# Patient Record
Sex: Female | Born: 1975 | Hispanic: No | Marital: Married | Smoking: Never smoker
Health system: Southern US, Community
[De-identification: ages and names within clinical notes are randomized; demographics above are authoritative.]

## PROBLEM LIST (undated history)

## (undated) DIAGNOSIS — E119 Type 2 diabetes mellitus without complications: Secondary | ICD-10-CM

## (undated) DIAGNOSIS — I10 Essential (primary) hypertension: Secondary | ICD-10-CM

## (undated) HISTORY — PX: ABDOMINAL SURGERY: SHX537

---

## 2006-02-12 ENCOUNTER — Ambulatory Visit: Payer: Self-pay | Admitting: *Deleted

## 2006-02-12 ENCOUNTER — Ambulatory Visit: Payer: Self-pay | Admitting: Family Medicine

## 2006-02-28 ENCOUNTER — Ambulatory Visit: Payer: Self-pay | Admitting: Family Medicine

## 2006-03-07 ENCOUNTER — Ambulatory Visit: Payer: Self-pay | Admitting: Family Medicine

## 2006-07-29 ENCOUNTER — Ambulatory Visit: Payer: Self-pay | Admitting: Family Medicine

## 2006-08-19 ENCOUNTER — Encounter (INDEPENDENT_AMBULATORY_CARE_PROVIDER_SITE_OTHER): Payer: Self-pay | Admitting: *Deleted

## 2006-09-09 ENCOUNTER — Encounter (INDEPENDENT_AMBULATORY_CARE_PROVIDER_SITE_OTHER): Payer: Self-pay | Admitting: Family Medicine

## 2006-09-09 ENCOUNTER — Ambulatory Visit: Payer: Self-pay | Admitting: Family Medicine

## 2006-09-09 LAB — CONVERTED CEMR LAB
Basophils Relative: 0 % (ref 0–1)
Eosinophils Relative: 1 % (ref 0–5)
Hemoglobin: 12.8 g/dL (ref 12.0–15.0)
Lymphocytes Relative: 29 % (ref 12–46)
Monocytes Relative: 8 % (ref 3–11)
Neutro Abs: 5.8 10*3/uL (ref 1.7–7.7)
Platelets: 205 10*3/uL (ref 150–400)
RDW: 12.8 % (ref 11.5–14.0)
Rh Type: POSITIVE
Rubella: 89.9 intl units/mL — ABNORMAL HIGH
WBC: 9.4 10*3/uL (ref 4.0–10.5)

## 2006-09-16 ENCOUNTER — Encounter (INDEPENDENT_AMBULATORY_CARE_PROVIDER_SITE_OTHER): Payer: Self-pay | Admitting: Family Medicine

## 2006-09-16 ENCOUNTER — Encounter (INDEPENDENT_AMBULATORY_CARE_PROVIDER_SITE_OTHER): Payer: Self-pay | Admitting: Specialist

## 2006-09-16 ENCOUNTER — Ambulatory Visit: Payer: Self-pay | Admitting: Family Medicine

## 2006-09-16 LAB — CONVERTED CEMR LAB: GC Probe Amp, Genital: NEGATIVE

## 2006-10-09 ENCOUNTER — Ambulatory Visit (HOSPITAL_COMMUNITY): Admission: RE | Admit: 2006-10-09 | Discharge: 2006-10-09 | Payer: Self-pay | Admitting: Family Medicine

## 2006-10-16 ENCOUNTER — Ambulatory Visit: Payer: Self-pay | Admitting: Sports Medicine

## 2006-10-17 ENCOUNTER — Encounter (INDEPENDENT_AMBULATORY_CARE_PROVIDER_SITE_OTHER): Payer: Self-pay | Admitting: *Deleted

## 2006-10-27 ENCOUNTER — Telehealth (INDEPENDENT_AMBULATORY_CARE_PROVIDER_SITE_OTHER): Payer: Self-pay | Admitting: Family Medicine

## 2006-11-13 ENCOUNTER — Ambulatory Visit: Payer: Self-pay | Admitting: Family Medicine

## 2006-12-05 ENCOUNTER — Ambulatory Visit: Payer: Self-pay | Admitting: Family Medicine

## 2006-12-05 LAB — CONVERTED CEMR LAB
Bilirubin Urine: NEGATIVE
Hemoglobin: 10.9 g/dL
Protein, U semiquant: NEGATIVE
Whiff Test: NEGATIVE
pH: 6.5

## 2006-12-11 ENCOUNTER — Ambulatory Visit: Payer: Self-pay | Admitting: Sports Medicine

## 2006-12-11 ENCOUNTER — Encounter (INDEPENDENT_AMBULATORY_CARE_PROVIDER_SITE_OTHER): Payer: Self-pay | Admitting: Family Medicine

## 2006-12-15 ENCOUNTER — Telehealth: Payer: Self-pay | Admitting: *Deleted

## 2006-12-23 ENCOUNTER — Ambulatory Visit: Payer: Self-pay | Admitting: Family Medicine

## 2007-01-01 ENCOUNTER — Telehealth: Payer: Self-pay | Admitting: *Deleted

## 2007-01-07 ENCOUNTER — Telehealth (INDEPENDENT_AMBULATORY_CARE_PROVIDER_SITE_OTHER): Payer: Self-pay | Admitting: Family Medicine

## 2007-01-07 ENCOUNTER — Ambulatory Visit: Payer: Self-pay | Admitting: Sports Medicine

## 2007-01-08 ENCOUNTER — Telehealth (INDEPENDENT_AMBULATORY_CARE_PROVIDER_SITE_OTHER): Payer: Self-pay | Admitting: *Deleted

## 2007-01-09 ENCOUNTER — Ambulatory Visit: Payer: Self-pay | Admitting: Family Medicine

## 2007-01-29 ENCOUNTER — Encounter (INDEPENDENT_AMBULATORY_CARE_PROVIDER_SITE_OTHER): Payer: Self-pay | Admitting: Family Medicine

## 2007-01-29 ENCOUNTER — Ambulatory Visit: Payer: Self-pay | Admitting: Family Medicine

## 2007-01-31 LAB — CONVERTED CEMR LAB
AST: 13 units/L (ref 0–37)
Creatinine, Ser: 0.55 mg/dL (ref 0.40–1.20)
Glucose, Bld: 93 mg/dL (ref 70–99)
Potassium: 3.9 meq/L (ref 3.5–5.3)

## 2007-02-02 ENCOUNTER — Encounter (INDEPENDENT_AMBULATORY_CARE_PROVIDER_SITE_OTHER): Payer: Self-pay | Admitting: Family Medicine

## 2007-02-02 ENCOUNTER — Ambulatory Visit: Payer: Self-pay | Admitting: Family Medicine

## 2007-02-02 LAB — CONVERTED CEMR LAB
Chlamydia, DNA Probe: NEGATIVE
GC Probe Amp, Genital: NEGATIVE

## 2007-02-10 ENCOUNTER — Ambulatory Visit: Payer: Self-pay | Admitting: Family Medicine

## 2007-02-19 ENCOUNTER — Ambulatory Visit: Payer: Self-pay | Admitting: Family Medicine

## 2007-02-23 ENCOUNTER — Telehealth: Payer: Self-pay | Admitting: *Deleted

## 2007-02-24 ENCOUNTER — Inpatient Hospital Stay (HOSPITAL_COMMUNITY): Admission: AD | Admit: 2007-02-24 | Discharge: 2007-02-26 | Payer: Self-pay | Admitting: Obstetrics & Gynecology

## 2007-02-24 ENCOUNTER — Ambulatory Visit: Payer: Self-pay | Admitting: Gynecology

## 2007-02-26 ENCOUNTER — Telehealth (INDEPENDENT_AMBULATORY_CARE_PROVIDER_SITE_OTHER): Payer: Self-pay | Admitting: Family Medicine

## 2007-03-03 ENCOUNTER — Telehealth (INDEPENDENT_AMBULATORY_CARE_PROVIDER_SITE_OTHER): Payer: Self-pay | Admitting: Family Medicine

## 2007-03-10 ENCOUNTER — Telehealth (INDEPENDENT_AMBULATORY_CARE_PROVIDER_SITE_OTHER): Payer: Self-pay | Admitting: Family Medicine

## 2007-03-11 ENCOUNTER — Encounter (INDEPENDENT_AMBULATORY_CARE_PROVIDER_SITE_OTHER): Payer: Self-pay | Admitting: Family Medicine

## 2007-03-12 ENCOUNTER — Telehealth (INDEPENDENT_AMBULATORY_CARE_PROVIDER_SITE_OTHER): Payer: Self-pay | Admitting: Family Medicine

## 2007-04-15 ENCOUNTER — Ambulatory Visit: Payer: Self-pay | Admitting: Family Medicine

## 2007-07-06 ENCOUNTER — Encounter: Payer: Self-pay | Admitting: Internal Medicine

## 2007-07-06 ENCOUNTER — Ambulatory Visit: Payer: Self-pay | Admitting: Internal Medicine

## 2007-07-06 LAB — CONVERTED CEMR LAB: GC Probe Amp, Genital: NEGATIVE

## 2007-11-19 ENCOUNTER — Ambulatory Visit: Payer: Self-pay | Admitting: Internal Medicine

## 2007-12-02 ENCOUNTER — Ambulatory Visit: Payer: Self-pay | Admitting: Internal Medicine

## 2007-12-04 ENCOUNTER — Ambulatory Visit: Payer: Self-pay | Admitting: Internal Medicine

## 2008-02-25 ENCOUNTER — Ambulatory Visit: Payer: Self-pay | Admitting: Internal Medicine

## 2008-05-18 ENCOUNTER — Ambulatory Visit: Payer: Self-pay | Admitting: Internal Medicine

## 2008-08-15 ENCOUNTER — Ambulatory Visit: Payer: Self-pay | Admitting: Internal Medicine

## 2008-09-22 ENCOUNTER — Ambulatory Visit: Payer: Self-pay | Admitting: Internal Medicine

## 2008-10-14 ENCOUNTER — Ambulatory Visit: Payer: Self-pay | Admitting: Internal Medicine

## 2008-10-30 ENCOUNTER — Emergency Department (HOSPITAL_COMMUNITY): Admission: EM | Admit: 2008-10-30 | Discharge: 2008-10-31 | Payer: Self-pay | Admitting: Emergency Medicine

## 2008-11-02 ENCOUNTER — Ambulatory Visit: Payer: Self-pay | Admitting: Internal Medicine

## 2008-12-14 ENCOUNTER — Ambulatory Visit: Payer: Self-pay | Admitting: Internal Medicine

## 2008-12-19 ENCOUNTER — Ambulatory Visit: Payer: Self-pay | Admitting: Internal Medicine

## 2009-03-13 ENCOUNTER — Ambulatory Visit: Payer: Self-pay | Admitting: Family Medicine

## 2009-06-02 ENCOUNTER — Ambulatory Visit: Payer: Self-pay | Admitting: Internal Medicine

## 2009-06-02 ENCOUNTER — Encounter: Payer: Self-pay | Admitting: Internal Medicine

## 2009-07-21 ENCOUNTER — Emergency Department (HOSPITAL_COMMUNITY): Admission: EM | Admit: 2009-07-21 | Discharge: 2009-07-21 | Payer: Self-pay | Admitting: Emergency Medicine

## 2009-08-30 ENCOUNTER — Ambulatory Visit: Payer: Self-pay | Admitting: Internal Medicine

## 2009-10-04 ENCOUNTER — Ambulatory Visit: Payer: Self-pay | Admitting: Internal Medicine

## 2009-10-04 LAB — CONVERTED CEMR LAB
BUN: 7 mg/dL (ref 6–23)
Basophils Absolute: 0 10*3/uL (ref 0.0–0.1)
Basophils Relative: 0 % (ref 0–1)
CO2: 21 meq/L (ref 19–32)
Chloride: 108 meq/L (ref 96–112)
Eosinophils Absolute: 0.1 10*3/uL (ref 0.0–0.7)
Eosinophils Relative: 1 % (ref 0–5)
HCT: 42.6 % (ref 36.0–46.0)
MCHC: 31.7 g/dL (ref 30.0–36.0)
Monocytes Relative: 5 % (ref 3–12)
Neutro Abs: 4 10*3/uL (ref 1.7–7.7)
RBC: 4.89 M/uL (ref 3.87–5.11)
RDW: 13.1 % (ref 11.5–15.5)
Sodium: 144 meq/L (ref 135–145)

## 2009-11-22 ENCOUNTER — Ambulatory Visit: Payer: Self-pay | Admitting: Internal Medicine

## 2009-11-30 ENCOUNTER — Ambulatory Visit: Payer: Self-pay | Admitting: Internal Medicine

## 2010-02-12 ENCOUNTER — Ambulatory Visit: Payer: Self-pay | Admitting: Internal Medicine

## 2010-04-30 ENCOUNTER — Ambulatory Visit: Payer: Self-pay | Admitting: Internal Medicine

## 2010-09-21 ENCOUNTER — Emergency Department (HOSPITAL_COMMUNITY): Payer: Self-pay

## 2010-09-21 ENCOUNTER — Emergency Department (HOSPITAL_COMMUNITY)
Admission: EM | Admit: 2010-09-21 | Discharge: 2010-09-21 | Disposition: A | Discharge status: Home / Self Care | Payer: Self-pay | Attending: Emergency Medicine | Admitting: Emergency Medicine

## 2010-09-21 DIAGNOSIS — R000 Tachycardia, unspecified: Secondary | ICD-10-CM | POA: Insufficient documentation

## 2010-09-21 DIAGNOSIS — N12 Tubulo-interstitial nephritis, not specified as acute or chronic: Secondary | ICD-10-CM | POA: Insufficient documentation

## 2010-09-21 DIAGNOSIS — R112 Nausea with vomiting, unspecified: Secondary | ICD-10-CM | POA: Insufficient documentation

## 2010-09-21 DIAGNOSIS — M545 Low back pain, unspecified: Secondary | ICD-10-CM | POA: Insufficient documentation

## 2010-09-21 DIAGNOSIS — R630 Anorexia: Secondary | ICD-10-CM | POA: Insufficient documentation

## 2010-09-21 DIAGNOSIS — R109 Unspecified abdominal pain: Secondary | ICD-10-CM | POA: Insufficient documentation

## 2010-09-21 DIAGNOSIS — M25559 Pain in unspecified hip: Secondary | ICD-10-CM | POA: Insufficient documentation

## 2010-09-21 DIAGNOSIS — R748 Abnormal levels of other serum enzymes: Secondary | ICD-10-CM | POA: Insufficient documentation

## 2010-09-21 LAB — COMPREHENSIVE METABOLIC PANEL
ALT: 22 U/L (ref 0–35)
BUN: 8 mg/dL (ref 6–23)
Calcium: 8.7 mg/dL (ref 8.4–10.5)
Glucose, Bld: 101 mg/dL — ABNORMAL HIGH (ref 70–99)
Potassium: 3.6 mEq/L (ref 3.5–5.1)

## 2010-09-21 LAB — DIFFERENTIAL
Basophils Absolute: 0 10*3/uL (ref 0.0–0.1)
Basophils Relative: 0 % (ref 0–1)
Lymphs Abs: 1.6 10*3/uL (ref 0.7–4.0)
Monocytes Relative: 6 % (ref 3–12)
Neutro Abs: 10.8 10*3/uL — ABNORMAL HIGH (ref 1.7–7.7)
Neutrophils Relative %: 82 % — ABNORMAL HIGH (ref 43–77)

## 2010-09-21 LAB — CBC
Hemoglobin: 13.1 g/dL (ref 12.0–15.0)
MCV: 84.4 fL (ref 78.0–100.0)
Platelets: 175 10*3/uL (ref 150–400)
RBC: 4.68 MIL/uL (ref 3.87–5.11)
RDW: 13.2 % (ref 11.5–15.5)
WBC: 13.2 10*3/uL — ABNORMAL HIGH (ref 4.0–10.5)

## 2010-09-21 LAB — URINE MICROSCOPIC-ADD ON

## 2010-09-21 LAB — URINALYSIS, ROUTINE W REFLEX MICROSCOPIC
Urine Glucose, Fasting: NEGATIVE mg/dL
pH: 6 (ref 5.0–8.0)

## 2010-09-21 LAB — LIPASE, BLOOD: Lipase: 19 U/L (ref 11–59)

## 2010-09-24 LAB — URINE CULTURE: Colony Count: >=100000

## 2010-09-26 ENCOUNTER — Inpatient Hospital Stay (HOSPITAL_COMMUNITY): Payer: Medicaid Other

## 2010-09-26 ENCOUNTER — Inpatient Hospital Stay (HOSPITAL_COMMUNITY)
Admission: AD | Admit: 2010-09-26 | Discharge: 2010-09-26 | Disposition: A | Discharge status: Home / Self Care | Payer: Medicaid Other | Source: Ambulatory Visit | Attending: Obstetrics and Gynecology | Admitting: Obstetrics and Gynecology

## 2010-09-26 DIAGNOSIS — R1110 Vomiting, unspecified: Secondary | ICD-10-CM | POA: Insufficient documentation

## 2010-09-26 DIAGNOSIS — Z30432 Encounter for removal of intrauterine contraceptive device: Secondary | ICD-10-CM | POA: Insufficient documentation

## 2010-09-26 DIAGNOSIS — N12 Tubulo-interstitial nephritis, not specified as acute or chronic: Secondary | ICD-10-CM | POA: Insufficient documentation

## 2010-09-26 LAB — URINE MICROSCOPIC-ADD ON

## 2010-09-26 LAB — CBC
HCT: 40.6 % (ref 36.0–46.0)
Hemoglobin: 13.2 g/dL (ref 12.0–15.0)
MCH: 27.2 pg (ref 26.0–34.0)
MCHC: 32.5 g/dL (ref 30.0–36.0)
RDW: 13.1 % (ref 11.5–15.5)

## 2010-09-26 LAB — URINALYSIS, ROUTINE W REFLEX MICROSCOPIC
Ketones, ur: NEGATIVE mg/dL
Nitrite: NEGATIVE
Protein, ur: NEGATIVE mg/dL
Specific Gravity, Urine: 1.015 (ref 1.005–1.030)
Urine Glucose, Fasting: NEGATIVE mg/dL

## 2010-09-26 LAB — DIFFERENTIAL
Lymphs Abs: 3.3 10*3/uL (ref 0.7–4.0)
Monocytes Relative: 7 % (ref 3–12)
Neutro Abs: 4.1 10*3/uL (ref 1.7–7.7)

## 2010-09-26 LAB — POCT PREGNANCY, URINE: Preg Test, Ur: NEGATIVE

## 2010-09-26 MED ORDER — IOHEXOL 300 MG/ML  SOLN
100.0000 mL | Freq: Once | INTRAMUSCULAR | Status: AC | PRN
  Administered 2010-09-26: 100 mL via INTRAVENOUS

## 2010-11-29 LAB — URINALYSIS, ROUTINE W REFLEX MICROSCOPIC
Bilirubin Urine: NEGATIVE
Nitrite: NEGATIVE
Specific Gravity, Urine: 1.014 (ref 1.005–1.030)
Urobilinogen, UA: 1 mg/dL (ref 0.0–1.0)

## 2010-11-29 LAB — DIFFERENTIAL
Basophils Absolute: 0 10*3/uL (ref 0.0–0.1)
Basophils Relative: 0 % (ref 0–1)
Neutro Abs: 9.7 10*3/uL — ABNORMAL HIGH (ref 1.7–7.7)
Neutrophils Relative %: 92 % — ABNORMAL HIGH (ref 43–77)

## 2010-11-29 LAB — BASIC METABOLIC PANEL
CO2: 21 mEq/L (ref 19–32)
Calcium: 8.3 mg/dL — ABNORMAL LOW (ref 8.4–10.5)
Creatinine, Ser: 0.82 mg/dL (ref 0.4–1.2)
GFR calc non Af Amer: >60 mL/min (ref >60)
Glucose, Bld: 106 mg/dL — ABNORMAL HIGH (ref 70–99)

## 2010-11-29 LAB — CBC
MCHC: 33.8 g/dL (ref 30.0–36.0)
Platelets: 163 10*3/uL (ref 150–400)
RDW: 13 % (ref 11.5–15.5)

## 2010-11-29 LAB — URINE MICROSCOPIC-ADD ON

## 2011-06-04 LAB — CBC
HCT: 31.4 — ABNORMAL LOW
Hemoglobin: 10.3 — ABNORMAL LOW
MCHC: 31.9
MCHC: 32.2
MCV: 69.2 — ABNORMAL LOW
Platelets: 180
RBC: 4.44
RDW: 18.5 — ABNORMAL HIGH
RDW: 18.6 — ABNORMAL HIGH
WBC: 9.8

## 2011-06-04 LAB — TSH: TSH: 1.336

## 2011-09-10 ENCOUNTER — Other Ambulatory Visit: Payer: Self-pay | Admitting: Family Medicine

## 2011-09-10 DIAGNOSIS — N644 Mastodynia: Secondary | ICD-10-CM

## 2011-09-10 DIAGNOSIS — N63 Unspecified lump in unspecified breast: Secondary | ICD-10-CM

## 2011-09-17 ENCOUNTER — Other Ambulatory Visit: Payer: Medicaid Other

## 2011-09-18 ENCOUNTER — Ambulatory Visit
Admission: RE | Admit: 2011-09-18 | Discharge: 2011-09-18 | Disposition: A | Discharge status: Home / Self Care | Payer: Self-pay | Source: Ambulatory Visit | Attending: Family Medicine | Admitting: Family Medicine

## 2011-09-18 DIAGNOSIS — N63 Unspecified lump in unspecified breast: Secondary | ICD-10-CM

## 2011-09-18 DIAGNOSIS — N644 Mastodynia: Secondary | ICD-10-CM

## 2011-10-20 ENCOUNTER — Encounter (HOSPITAL_COMMUNITY): Payer: Self-pay | Admitting: *Deleted

## 2011-10-20 ENCOUNTER — Inpatient Hospital Stay (HOSPITAL_COMMUNITY)
Admission: EM | Admit: 2011-10-20 | Discharge: 2011-10-21 | DRG: 639 | Disposition: A | Discharge status: Home / Self Care | Payer: Self-pay | Attending: Internal Medicine | Admitting: Internal Medicine

## 2011-10-20 ENCOUNTER — Emergency Department (HOSPITAL_COMMUNITY): Payer: Self-pay

## 2011-10-20 ENCOUNTER — Inpatient Hospital Stay (HOSPITAL_COMMUNITY): Payer: Self-pay

## 2011-10-20 DIAGNOSIS — E1010 Type 1 diabetes mellitus with ketoacidosis without coma: Principal | ICD-10-CM | POA: Diagnosis present

## 2011-10-20 DIAGNOSIS — R51 Headache: Secondary | ICD-10-CM | POA: Diagnosis present

## 2011-10-20 DIAGNOSIS — R5381 Other malaise: Secondary | ICD-10-CM | POA: Diagnosis present

## 2011-10-20 DIAGNOSIS — E860 Dehydration: Secondary | ICD-10-CM

## 2011-10-20 DIAGNOSIS — E1110 Type 2 diabetes mellitus with ketoacidosis without coma: Secondary | ICD-10-CM

## 2011-10-20 DIAGNOSIS — E119 Type 2 diabetes mellitus without complications: Secondary | ICD-10-CM

## 2011-10-20 DIAGNOSIS — E1100 Type 2 diabetes mellitus with hyperosmolarity without nonketotic hyperglycemic-hyperosmolar coma (NKHHC): Secondary | ICD-10-CM | POA: Insufficient documentation

## 2011-10-20 DIAGNOSIS — R5383 Other fatigue: Secondary | ICD-10-CM | POA: Diagnosis present

## 2011-10-20 LAB — URINALYSIS, ROUTINE W REFLEX MICROSCOPIC
Bilirubin Urine: NEGATIVE
Glucose, UA: >1000 mg/dL — AB
Ketones, ur: >80 mg/dL — AB
Leukocytes, UA: NEGATIVE
Nitrite: NEGATIVE
Protein, ur: 30 mg/dL — AB
Specific Gravity, Urine: 1.044 — ABNORMAL HIGH (ref 1.005–1.030)
Urobilinogen, UA: 0.2 mg/dL (ref 0.0–1.0)
pH: 5.5 (ref 5.0–8.0)

## 2011-10-20 LAB — BASIC METABOLIC PANEL WITH GFR
BUN: 19 mg/dL (ref 6–23)
CO2: 17 meq/L — ABNORMAL LOW (ref 19–32)
Calcium: 10.9 mg/dL — ABNORMAL HIGH (ref 8.4–10.5)
Chloride: 92 meq/L — ABNORMAL LOW (ref 96–112)
Creatinine, Ser: 0.8 mg/dL (ref 0.50–1.10)
GFR calc Af Amer: >90 mL/min
GFR calc non Af Amer: >90 mL/min
Glucose, Bld: 529 mg/dL — ABNORMAL HIGH (ref 70–99)
Potassium: 4.2 meq/L (ref 3.5–5.1)
Sodium: 130 meq/L — ABNORMAL LOW (ref 135–145)

## 2011-10-20 LAB — BASIC METABOLIC PANEL
Calcium: 8.9 mg/dL (ref 8.4–10.5)
Calcium: 9.1 mg/dL (ref 8.4–10.5)
Chloride: 106 mEq/L (ref 96–112)
Creatinine, Ser: 0.65 mg/dL (ref 0.50–1.10)
GFR calc Af Amer: >90 mL/min (ref >90)
GFR calc Af Amer: >90 mL/min (ref >90)
GFR calc non Af Amer: >90 mL/min (ref >90)
GFR calc non Af Amer: >90 mL/min (ref >90)
Sodium: 134 mEq/L — ABNORMAL LOW (ref 135–145)

## 2011-10-20 LAB — CBC
HCT: 41.8 % (ref 36.0–46.0)
Hemoglobin: 14.7 g/dL (ref 12.0–15.0)
MCH: 28.7 pg (ref 26.0–34.0)
MCHC: 35.2 g/dL (ref 30.0–36.0)
MCV: 81.6 fL (ref 78.0–100.0)
Platelets: 231 K/uL (ref 150–400)
RBC: 5.12 MIL/uL — ABNORMAL HIGH (ref 3.87–5.11)
RDW: 11.9 % (ref 11.5–15.5)
WBC: 9.9 K/uL (ref 4.0–10.5)

## 2011-10-20 LAB — DIFFERENTIAL
Basophils Absolute: 0 10*3/uL (ref 0.0–0.1)
Lymphocytes Relative: 36 % (ref 12–46)
Monocytes Absolute: 0.4 10*3/uL (ref 0.1–1.0)
Monocytes Relative: 4 % (ref 3–12)
Neutro Abs: 5.8 10*3/uL (ref 1.7–7.7)

## 2011-10-20 LAB — GLUCOSE, CAPILLARY
Glucose-Capillary: 522 mg/dL — ABNORMAL HIGH (ref 70–99)
Glucose-Capillary: 349 mg/dL — ABNORMAL HIGH (ref 70–99)
Glucose-Capillary: 201 mg/dL — ABNORMAL HIGH (ref 70–99)
Glucose-Capillary: 155 mg/dL — ABNORMAL HIGH (ref 70–99)

## 2011-10-20 LAB — HEMOGLOBIN A1C: Mean Plasma Glucose: 214 mg/dL — ABNORMAL HIGH (ref <117)

## 2011-10-20 LAB — URINE MICROSCOPIC-ADD ON

## 2011-10-20 LAB — CARDIAC PANEL(CRET KIN+CKTOT+MB+TROPI)
Relative Index: 1.8 (ref 0.0–2.5)
Troponin I: <0.3 ng/mL (ref <0.30)

## 2011-10-20 MED ORDER — SODIUM CHLORIDE 0.9 % IV SOLN
INTRAVENOUS | Status: DC
  Administered 2011-10-20 – 2011-10-21 (×2): via INTRAVENOUS

## 2011-10-20 MED ORDER — ACETAMINOPHEN 325 MG PO TABS: 650.0000 mg | ORAL_TABLET | Freq: Four times a day (QID) | ORAL | Status: DC | PRN

## 2011-10-20 MED ORDER — INSULIN GLARGINE 100 UNIT/ML ~~LOC~~ SOLN
10.0000 [IU] | Freq: Every day | SUBCUTANEOUS | Status: DC
  Administered 2011-10-20: 10 [IU] via SUBCUTANEOUS
  Filled 2011-10-20: qty 1

## 2011-10-20 MED ORDER — ACETAMINOPHEN 650 MG RE SUPP: 650.0000 mg | Freq: Four times a day (QID) | RECTAL | Status: DC | PRN

## 2011-10-20 MED ORDER — DEXTROSE 50 % IV SOLN: 25.0000 mL | INTRAVENOUS | Status: DC | PRN

## 2011-10-20 MED ORDER — ONDANSETRON HCL 4 MG/2ML IJ SOLN: 4.0000 mg | Freq: Four times a day (QID) | INTRAMUSCULAR | Status: DC | PRN

## 2011-10-20 MED ORDER — ONDANSETRON HCL 4 MG/2ML IJ SOLN
4.0000 mg | Freq: Once | INTRAMUSCULAR | Status: AC
  Administered 2011-10-20: 4 mg via INTRAVENOUS
  Filled 2011-10-20: qty 2

## 2011-10-20 MED ORDER — SODIUM CHLORIDE 0.9 % IV BOLUS (SEPSIS)
500.0000 mL | Freq: Once | INTRAVENOUS | Status: AC
  Administered 2011-10-20: 500 mL via INTRAVENOUS

## 2011-10-20 MED ORDER — SODIUM CHLORIDE 0.9 % IV SOLN
INTRAVENOUS | Status: DC
  Administered 2011-10-20: 10:00:00 via INTRAVENOUS

## 2011-10-20 MED ORDER — MORPHINE SULFATE 2 MG/ML IJ SOLN: 1.0000 mg | INTRAMUSCULAR | Status: DC | PRN

## 2011-10-20 MED ORDER — SODIUM CHLORIDE 0.9 % IV SOLN
INTRAVENOUS | Status: DC
  Administered 2011-10-20: 2.8 [IU]/h via INTRAVENOUS
  Administered 2011-10-20: 2.9 [IU]/h via INTRAVENOUS
  Administered 2011-10-20: 1.9 [IU]/h via INTRAVENOUS
  Filled 2011-10-20: qty 1

## 2011-10-20 MED ORDER — INSULIN ASPART 100 UNIT/ML ~~LOC~~ SOLN
0.0000 [IU] | Freq: Three times a day (TID) | SUBCUTANEOUS | Status: DC
  Administered 2011-10-20: 7 [IU] via SUBCUTANEOUS
  Administered 2011-10-21: 5 [IU] via SUBCUTANEOUS
  Administered 2011-10-21: 3 [IU] via SUBCUTANEOUS

## 2011-10-20 MED ORDER — ONDANSETRON HCL 4 MG PO TABS: 4.0000 mg | ORAL_TABLET | Freq: Four times a day (QID) | ORAL | Status: DC | PRN

## 2011-10-20 MED ORDER — DEXTROSE-NACL 5-0.45 % IV SOLN
INTRAVENOUS | Status: DC
  Administered 2011-10-20: 14:00:00 via INTRAVENOUS

## 2011-10-20 MED ORDER — INSULIN ASPART 100 UNIT/ML ~~LOC~~ SOLN
0.0000 [IU] | Freq: Three times a day (TID) | SUBCUTANEOUS | Status: DC
  Administered 2011-10-20: 1 [IU] via SUBCUTANEOUS

## 2011-10-20 MED ORDER — SENNOSIDES-DOCUSATE SODIUM 8.6-50 MG PO TABS
1.0000 | ORAL_TABLET | Freq: Every evening | ORAL | Status: DC | PRN
  Filled 2011-10-20: qty 1

## 2011-10-20 MED ORDER — SODIUM CHLORIDE 0.9 % IV SOLN
INTRAVENOUS | Status: DC
  Administered 2011-10-20 (×2): via INTRAVENOUS

## 2011-10-20 MED ORDER — PNEUMOCOCCAL VAC POLYVALENT 25 MCG/0.5ML IJ INJ
0.5000 mL | INJECTION | INTRAMUSCULAR | Status: AC
  Administered 2011-10-21: 0.5 mL via INTRAMUSCULAR
  Filled 2011-10-20: qty 0.5

## 2011-10-20 MED ORDER — HYDROCODONE-ACETAMINOPHEN 5-325 MG PO TABS
1.0000 | ORAL_TABLET | ORAL | Status: DC | PRN
  Administered 2011-10-20: 2 via ORAL
  Filled 2011-10-20: qty 2

## 2011-10-20 MED ORDER — SODIUM CHLORIDE 0.9 % IV BOLUS (SEPSIS)
1000.0000 mL | Freq: Once | INTRAVENOUS | Status: AC
  Administered 2011-10-20: 1000 mL via INTRAVENOUS

## 2011-10-20 NOTE — H&P (Signed)
Hospital Admission Note Date: 10/20/2011  PCP: none Chief Complaint: fatigue, headaches, decrease appetite.   History of Present Illness: 36 year old with no significant PMH presents to ED complaining of headaches, frontal area. Also relates fatigue and decrease appetite for last 5 days. She has polyuria. She denies speech changes or difficulty, no weakness, no chest pain , SOB, dysuria or cough. No prior history of diabetes. She had some subjective fevers. Headaches is better. No neck pain. No nucha rigidity.    Allergies: Quinine derivatives History reviewed. No pertinent past medical history. Prior to Admission medications   Not on File   Past Surgical History  Procedure Date  . Abdominal surgery    History reviewed. No pertinent family history. History   Social History  . Marital Status: Married    Spouse Name: N/A    Number of Children: 3    Social History Main Topics  . Smoking status: Never Smoker   . Smokeless tobacco: Never Used  . Alcohol Use: No  . Drug Use: No  . Sexually Active:         She is from [place]. She has been in [place] for last 6 years. She speak french.    REVIEW OF SYSTEMS:  Constitutional:  No weight loss, night sweats.  HEENT:   Difficulty swallowing,Tooth/dental problems,Sore throat,  No sneezing, itching, ear ache, nasal congestion, post nasal drip,  Cardio-vascular:  No chest pain, Orthopnea, PND, swelling in lower extremities, anasarca, dizziness, palpitations  GI:  No heartburn, indigestion, abdominal pain,  vomiting, diarrhea, change in bowel habits, loss of appetite  Resp:  No shortness of breath with exertion or at rest. No excess mucus, no productive cough, No non-productive cough, No coughing up of blood.No change in color of mucus.No wheezing.No chest wall deformity  Skin:  no rash or lesions.  GU:  no dysuria, change in color of urine, no urgency or frequency. No flank pain.  Musculoskeletal:  No joint pain or swelling. No  decreased range of motion. No back pain.  Psych:  No change in mood or affect. No depression or anxiety. No memory loss.   Physical Exam: Filed Vitals:   10/20/11 0744 10/20/11 0809 10/20/11 1129 10/20/11 1435  BP: 140/88 130/87 124/81 124/78  Pulse: 120 110 103 104  Temp: 98.3 F (36.8 C) 98.8 F (37.1 C) 98.9 F (37.2 C) 98.5 F (36.9 C)  TempSrc: Oral Oral  Oral  Resp: 18  22   Weight: 85.276 kg (188 lb)     SpO2: 100% 100% 100% 100%    Intake/Output Summary (Last 24 hours) at 10/20/11 1502 Last data filed at 10/20/11 1453  Gross per 24 hour  Intake   3350 ml  Output      0 ml  Net   3350 ml   BP 124/78  Pulse 104  Temp(Src) 98.5 F (36.9 C) (Oral)  Resp 22  Wt 85.276 kg (188 lb)  SpO2 100%  General Appearance:    Alert, cooperative, no distress, appears stated age  Head:    Normocephalic, without obvious abnormality, atraumatic  Eyes:    PERRL, conjunctiva/corneas clear, EOM's intact, fundi    benign, both eyes  Ears:    Normal TM's and external ear canals, both ears  Nose:   Nares normal, septum midline, mucosa normal, no drainage    or sinus tenderness  Throat:   Lips, mucosa, and tongue dry; teeth and gums normal  Neck:   Supple, symmetrical, trachea midline,  no adenopathy;    thyroid:  no enlargement/tenderness/nodules; no carotid   bruit or JVD  Back:     Symmetric, no curvature, ROM normal, no CVA tenderness  Lungs:     Clear to auscultation bilaterally, respirations unlabored  Chest Wall:    No tenderness or deformity   Heart:    Regular rate and rhythm, S1 and S2 normal, no murmur, rub   or gallop     Abdomen:     Soft, non-tender, bowel sounds active all four quadrants,    no masses, no organomegaly        Extremities:   Extremities normal, atraumatic, no cyanosis or edema  Pulses:   2+ and symmetric all extremities  Skin:   Skin color, texture, turgor normal, no rashes or lesions  Lymph nodes:   Cervical, supraclavicular, and axillary nodes  normal  Neurologic:   CNII-XII intact, normal strength, sensation and reflexes    throughout   Lab results:  [facility] 10/20/11 1335 10/20/11 0820  NA 138 130*  K 3.8 4.2  CL 106 92*  CO2 21 17*  GLUCOSE 192* 529*  BUN 13 19  CREATININE 0.65 0.80  CALCIUM 8.9 10.9*  MG -- --  PHOS -- --    Basename 10/20/11 0820  WBC 9.9  NEUTROABS 5.8  HGB 14.7  HCT 41.8  MCV 81.6  PLT 231      Patient Active Hospital Problem List:  1-Newly diagnosed DM/ DKA.  Patient with increase anion gap at 21, HCO3 17. Repeat Bmet, HCO3 at 21 gap at 11 now. Will transition from insulin Gtt . Will give 10 units of Lantus, then stop insulin Gtt one hour after. I will add SSI. Will check HB A1c. Will due a work up for infection, check chest x ray. Will cycle cardiac enzymes, check EKG.  Diabetes education consult. Patient will likely need to be discharge on insulin.      Adebayo Ensminger M.D. [facility] [phone] 10/20/2011, 3:02 PM

## 2011-10-20 NOTE — ED Notes (Signed)
Husband  Budd Palmer, [phone]) call if needed

## 2011-10-20 NOTE — ED Notes (Signed)
CBG machine not documenting most recent value of 314 @1130 . RN notified.

## 2011-10-20 NOTE — ED Provider Notes (Signed)
History     CSN: [id number]  Arrival date & time 10/20/11  9629   First MD Initiated Contact with Patient 10/20/11 [phone]      Chief Complaint  Patient presents with  . Weakness    (Consider location/radiation/quality/duration/timing/severity/associated sxs/prior treatment) HPI Comments: Tanya [name] is a 36 y.o. female  Patient is a 36 y.o. female presenting with weakness. The history is provided by the patient. The history is limited by a language barrier. A language interpreter was used [employer]).  Weakness Primary symptoms do not include headaches, syncope, loss of consciousness, dizziness, visual change, focal weakness, fever, nausea or vomiting. The symptoms began 3 to 5 days ago. Context: no known provocation.  Additional symptoms include weakness. Associated medical issues comments: No prior similar problem..   She has recently had weight loss, amount unknown; also has had polyuria, polydipsia, and polyphagia. No medications were tried for the problem.   History reviewed. No pertinent past medical history.  Past Surgical History  Procedure Date  . Abdominal surgery     History reviewed. No pertinent family history.  History  Substance Use Topics  . Smoking status: Never Smoker   . Smokeless tobacco: Never Used  . Alcohol Use: Not on file    OB History    Grav Para Term Preterm Abortions TAB SAB Ect Mult Living                  Review of Systems  Constitutional: Negative for fever.  Cardiovascular: Negative for syncope.  Gastrointestinal: Negative for nausea and vomiting.  Neurological: Positive for weakness. Negative for dizziness, focal weakness, loss of consciousness and headaches.  All other systems reviewed and are negative.    Allergies  Quinine derivatives  Home Medications   Current Outpatient Rx  Name Route Sig Dispense Refill  . INSULIN GLARGINE 100 UNIT/ML [place] SOLN Subcutaneous Inject 15 Units into the skin at bedtime. 10 mL 3      BP 121/84  Pulse 98  Temp(Src) 98.2 F (36.8 C) (Oral)  Resp 20  Ht 5\' 6"  (1.676 m)  Wt 196 lb 3.2 oz (88.996 kg)  BMI 31.67 kg/m2  SpO2 100%  LMP 10/20/2011  Physical Exam  Nursing note and vitals reviewed. Constitutional: She is oriented to person, place, and time. She appears well-developed and well-nourished.  HENT:  Head: Normocephalic and atraumatic.  Eyes: Conjunctivae and EOM are normal. Pupils are equal, round, and reactive to light.  Neck: Normal range of motion and phonation normal. Neck supple.  Cardiovascular: Normal rate, regular rhythm and intact distal pulses.   Pulmonary/Chest: Effort normal and breath sounds normal. She exhibits no tenderness.  Abdominal: Soft. She exhibits no distension. There is no tenderness. There is no guarding.  Musculoskeletal: Normal range of motion.  Neurological: She is alert and oriented to person, place, and time. She has normal strength. She exhibits normal muscle tone.  Skin: Skin is warm and dry.  Psychiatric: She has a normal mood and affect. Her behavior is normal. Judgment and thought content normal.    ED Course  Procedures (including critical care time) Initial treatment. IV fluid bolused 2 L. Followup treatment based on labs: IV insulin per glucose stabilizer. CBG monitoring; gradually improved.  CRITICAL CARE Performed by: Mancel Bale L   Total critical care time: 35 min.  Critical care time was exclusive of separately billable procedures and treating other patients.  Critical care was necessary to treat or prevent imminent or life-threatening deterioration.  Critical  care was time spent personally by me on the following activities: development of treatment plan with patient and/or surrogate as well as nursing, discussions with consultants, evaluation of patient's response to treatment, examination of patient, obtaining history from patient or surrogate, ordering and performing treatments and interventions,  ordering and review of laboratory studies, ordering and review of radiographic studies, pulse oximetry and re-evaluation of patient's condition.     Labs Reviewed  BASIC METABOLIC PANEL - Abnormal; Notable for the following:    Sodium 130 (*)    Chloride 92 (*)    CO2 17 (*)    Glucose, Bld 529 (*)    Calcium 10.9 (*)    All other components within normal limits  CBC - Abnormal; Notable for the following:    RBC 5.12 (*)    All other components within normal limits  URINALYSIS, ROUTINE W REFLEX MICROSCOPIC - Abnormal; Notable for the following:    Specific Gravity, Urine 1.044 (*)    Glucose, UA >1000 (*)    Hgb urine dipstick LARGE (*)    Ketones, ur >80 (*)    Protein, ur 30 (*)    All other components within normal limits  GLUCOSE, CAPILLARY - Abnormal; Notable for the following:    Glucose-Capillary 522 (*)    All other components within normal limits  GLUCOSE, CAPILLARY - Abnormal; Notable for the following:    Glucose-Capillary 349 (*)    All other components within normal limits  GLUCOSE, CAPILLARY - Abnormal; Notable for the following:    Glucose-Capillary 201 (*)    All other components within normal limits  BASIC METABOLIC PANEL - Abnormal; Notable for the following:    Glucose, Bld 192 (*)    All other components within normal limits  GLUCOSE, CAPILLARY - Abnormal; Notable for the following:    Glucose-Capillary 155 (*)    All other components within normal limits  HEMOGLOBIN A1C - Abnormal; Notable for the following:    Hemoglobin A1C 9.1 (*)    Mean Plasma Glucose 214 (*)    All other components within normal limits  BASIC METABOLIC PANEL - Abnormal; Notable for the following:    Sodium 134 (*)    Glucose, Bld 257 (*)    All other components within normal limits  COMPREHENSIVE METABOLIC PANEL - Abnormal; Notable for the following:    Sodium 133 (*)    Glucose, Bld 242 (*)    Albumin 3.3 (*)    All other components within normal limits  GLUCOSE,  CAPILLARY - Abnormal; Notable for the following:    Glucose-Capillary 138 (*)    All other components within normal limits  GLUCOSE, CAPILLARY - Abnormal; Notable for the following:    Glucose-Capillary 328 (*)    All other components within normal limits  GLUCOSE, CAPILLARY - Abnormal; Notable for the following:    Glucose-Capillary 243 (*)    All other components within normal limits  DIFFERENTIAL  POCT PREGNANCY, URINE  URINE MICROSCOPIC-ADD ON  CARDIAC PANEL(CRET KIN+CKTOT+MB+TROPI)  CBC  URINE CULTURE   X-ray Chest Pa And Lateral   10/20/2011  *RADIOLOGY REPORT*  Clinical Data: Hyperglycemia  CHEST - 2 VIEW  Comparison: None.  Findings: Normal mediastinum and cardiac silhouette.  Normal pulmonary  vasculature.  No evidence of effusion, infiltrate, or pneumothorax.  No acute bony abnormality.  IMPRESSION: No acute cardiopulmonary process.  Original Report Authenticated By: Genevive Bi, M.D.   Consultation with [facility] for admission, holding orders written- 11:51  1. Diabetes  mellitus   2. Dehydration       MDM  New-onset diabetes with dehydration, borderline ketosis with anion gap=11. Patient will need to be admitted to telemetry for monitoring. Her potassium is normal, but may decrease with treatment. She is eligible to be admitted to a telemetry bed.  Plan: Admit- unassigned patient (PCP is [facility])       Flint Melter, MD 10/21/11 1024

## 2011-10-20 NOTE — Plan of Care (Signed)
Problem: Phase I Progression Outcomes Goal: Initial discharge plan identified Outcome: Completed/Met Date Met:  10/20/11 Pt to return home with husband        

## 2011-10-21 ENCOUNTER — Other Ambulatory Visit: Payer: Self-pay

## 2011-10-21 LAB — URINE CULTURE: Culture  Setup Time: 201303031440

## 2011-10-21 LAB — CBC
HCT: 36.9 % (ref 36.0–46.0)
MCHC: 33.3 g/dL (ref 30.0–36.0)
MCV: 83.1 fL (ref 78.0–100.0)
RDW: 12.3 % (ref 11.5–15.5)

## 2011-10-21 LAB — COMPREHENSIVE METABOLIC PANEL
ALT: 17 U/L (ref 0–35)
Albumin: 3.3 g/dL — ABNORMAL LOW (ref 3.5–5.2)
Alkaline Phosphatase: 93 U/L (ref 39–117)
BUN: 10 mg/dL (ref 6–23)
Potassium: 3.8 mEq/L (ref 3.5–5.1)
Sodium: 133 mEq/L — ABNORMAL LOW (ref 135–145)
Total Protein: 6 g/dL (ref 6.0–8.3)

## 2011-10-21 MED ORDER — INSULIN PEN STARTER KIT: 1.0000 | Freq: Once | Status: DC

## 2011-10-21 MED ORDER — INSULIN GLARGINE 100 UNIT/ML ~~LOC~~ SOLN
15.0000 [IU] | Freq: Every day | SUBCUTANEOUS | Status: DC
  Administered 2011-10-21: 15 [IU] via SUBCUTANEOUS
  Filled 2011-10-21: qty 3

## 2011-10-21 MED ORDER — BD GETTING STARTED TAKE HOME KIT: 3/10ML X 30G SYRINGES
1.0000 | Freq: Once | Status: DC
  Filled 2011-10-21: qty 1

## 2011-10-21 MED ORDER — INSULIN PEN STARTER KIT
1.0000 | Freq: Once | Status: AC
  Administered 2011-10-21: 1
  Filled 2011-10-21: qty 1

## 2011-10-21 MED ORDER — INSULIN GLARGINE 100 UNIT/ML ~~LOC~~ SOLN: 15.0000 [IU] | Freq: Every day | SUBCUTANEOUS | Status: DC

## 2011-10-21 NOTE — Progress Notes (Signed)
Inpatient Diabetes Program Recommendations  AACE/ADA: New Consensus Statement on Inpatient Glycemic Control (2009)  Target Ranges:  Prepandial:   less than 140 mg/dL      Peak postprandial:   less than 180 mg/dL (1-2 hours)      Critically ill patients:  140 - 180 mg/dL   Reason for Visit: Consult for newly-diagnosed diabetes  History of Present Illness:  36 year old with no significant PMH presents to ED complaining of headaches, frontal area. Also relates fatigue and decrease appetite for last 5 days. She has polyuria. She denies speech changes or difficulty, no weakness, no chest pain , SOB, dysuria or cough. No prior history of diabetes. She had some subjective fevers. Headaches is better. No neck pain. No nucha rigidity.   Results for TYASIA, PACKARD (MRN [id number]) as of 10/21/2011 11:50  Ref. Range 10/20/2011 21:01 10/21/2011 04:24 10/21/2011 05:40 10/21/2011 07:32 10/21/2011 11:44  Glucose-Capillary Latest Range: 70-99 mg/dL 409 (H)   811 (H) 914 (H)  Glucose Latest Range: 70-99 mg/dL  782 (H)     Results for ARDIS, LAWLEY (MRN [id number]) as of 10/21/2011 11:50  Ref. Range 10/20/2011 08:20  Hemoglobin A1C Latest Range: <5.7 % 9.1 (H)       Note: Pt to be discharged on Lantus 15 units QHS.  Insulin pen will be given and will last until MD appt at [facility] on November 07, 2011.  Gave info on Diabetes basics, hypoglycemia, meal planning and insulin admin.  Interpreter gave instructions in [place].  Answered questions.  Stressed importance of follow up with physician, monitoring blood sugars, and hypoglycemia treatment.  Pt verbalized understanding.  RN teaching insulin admin. Ordered insulin pen starter kit.

## 2011-10-21 NOTE — Discharge Instructions (Signed)
Please call and reschedule if unable to keep apt [phone]

## 2011-10-21 NOTE — Progress Notes (Signed)
Patient is active with [facility] for follow up medical care - apt November 07, 2011 at 10:45 with Dr Sherron Flemings; patient qualifies for the indigent funds. Awaiting for interpreter to talk to the patient directly for follow up medical care; Abelino Derrick RN [phone]

## 2011-10-21 NOTE — Discharge Summary (Signed)
Admit date: 10/20/2011 Discharge date: 10/21/2011  Primary Care Physician:  No primary provider on file.   Discharge Diagnoses:    . Newly diagnosed diabetes 10/20/2011   . DKA (diabetic ketoacidoses) 10/20/2011              DISCHARGE MEDICATION: Medication List  As of 10/21/2011  9:55 AM   TAKE these medications         insulin glargine 100 UNIT/ML injection   Commonly known as: LANTUS   Inject 15 Units into the skin at bedtime.              Consults:  none   SIGNIFICANT DIAGNOSTIC STUDIES:  X-ray Chest Pa And Lateral   10/20/2011  *RADIOLOGY REPORT*  Clinical Data: Hyperglycemia  CHEST - 2 VIEW  Comparison: None.  Findings: Normal mediastinum and cardiac silhouette.  Normal pulmonary  vasculature.  No evidence of effusion, infiltrate, or pneumothorax.  No acute bony abnormality.  IMPRESSION: No acute cardiopulmonary process.  Original Report Authenticated By: Genevive Bi, M.D.     BRIEF ADMITTING H & P: 36 year old with no significant PMH presents to ED complaining of headaches, frontal area. Also relates fatigue and decrease appetite for last 5 days. She has polyuria. She denies speech changes or difficulty, no weakness, no chest pain , SOB, dysuria or cough. No prior history of diabetes. She had some subjective fevers. Headaches is better. No neck pain. No nucha rigidity.   Hospital Course:  1-Newly diagnosed DM/ DKA: Patient presents with Hyperglycemia, CBG at 500, low bicarb at 17, anion gap at 21. She presented with earlier DKA. She is new diagnosis diabetes. 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 She will need to be refer to Endocrinologist for antibodies, work up. She was initially started on insulin Gtt and IV fluids. She was subsequently transition to Lantus. She will be discharge on 15 units. She will need [facility] with [facility]. No evidence of infection. HBA1 C at 9.1. She was educated regarding diet. Patient was feeling better. No fatigue, no  headaches.    Disposition and Follow-up: with [facility]. Patient will need adjustment of insulin.  Discharge Orders    Future Orders Please Complete By Expires   Diet Carb Modified      Activity as tolerated - No restrictions           DISCHARGE EXAM:  General: no distress. CVS: S1 S2 RRR. Lungs: CTA. Abdomen: soft , NT, ND. Extremities; No edema.   Blood pressure 121/84, pulse 98, temperature 98.2 F (36.8 C), temperature source Oral, resp. rate 20, height 5\' 6"  (1.676 m), weight 88.996 kg (196 lb 3.2 oz), last menstrual period 10/20/2011, SpO2 100.00%.   Basename 10/21/11 0424 10/20/11 1830  NA 133* 134*  K 3.8 4.0  CL 103 102  CO2 20 19  GLUCOSE 242* 257*  BUN 10 12  CREATININE 0.68 0.67  CALCIUM 8.5 9.1  MG -- --  PHOS -- --    Basename 10/21/11 0424  AST 16  ALT 17  ALKPHOS 93  BILITOT 0.7  PROT 6.0  ALBUMIN 3.3*    Basename 10/21/11 0424 10/20/11 0820  WBC 8.5 9.9  NEUTROABS -- 5.8  HGB 12.3 14.7  HCT 36.9 41.8  MCV 83.1 81.6  PLT 181 231    Signed: Adalida Garver M.D. 10/21/2011, 9:55 AM

## 2011-10-21 NOTE — Progress Notes (Signed)
Talked to patient with interpretor present, patient is active with [facility] and fills all of her prescriptions there. Patient has good support at home/ independent prior to admission. Apt for [facility] given to the patient; B Lobbyist, BSN, [place]

## 2013-03-22 ENCOUNTER — Other Ambulatory Visit (HOSPITAL_COMMUNITY)
Admission: RE | Admit: 2013-03-22 | Discharge: 2013-03-22 | Disposition: A | Discharge status: Home / Self Care | Payer: Self-pay | Source: Ambulatory Visit | Attending: Emergency Medicine | Admitting: Emergency Medicine

## 2013-03-22 ENCOUNTER — Encounter (HOSPITAL_COMMUNITY): Payer: Self-pay | Admitting: Emergency Medicine

## 2013-03-22 ENCOUNTER — Emergency Department (INDEPENDENT_AMBULATORY_CARE_PROVIDER_SITE_OTHER)
Admission: EM | Admit: 2013-03-22 | Discharge: 2013-03-22 | Disposition: A | Discharge status: Home / Self Care | Payer: Self-pay | Source: Home / Self Care | Attending: Emergency Medicine | Admitting: Emergency Medicine

## 2013-03-22 DIAGNOSIS — N949 Unspecified condition associated with female genital organs and menstrual cycle: Secondary | ICD-10-CM

## 2013-03-22 DIAGNOSIS — N760 Acute vaginitis: Secondary | ICD-10-CM | POA: Insufficient documentation

## 2013-03-22 DIAGNOSIS — R102 Pelvic and perineal pain: Secondary | ICD-10-CM

## 2013-03-22 DIAGNOSIS — Z113 Encounter for screening for infections with a predominantly sexual mode of transmission: Secondary | ICD-10-CM | POA: Insufficient documentation

## 2013-03-22 DIAGNOSIS — R300 Dysuria: Secondary | ICD-10-CM

## 2013-03-22 HISTORY — DX: Type 2 diabetes mellitus without complications: E11.9

## 2013-03-22 HISTORY — DX: Essential (primary) hypertension: I10

## 2013-03-22 LAB — POCT URINALYSIS DIP (DEVICE)
Bilirubin Urine: NEGATIVE
Glucose, UA: NEGATIVE mg/dL
Ketones, ur: NEGATIVE mg/dL
pH: 5.5 (ref 5.0–8.0)

## 2013-03-22 MED ORDER — CEFTRIAXONE SODIUM 1 G IJ SOLR
1.0000 g | Freq: Once | INTRAMUSCULAR | Status: AC
  Administered 2013-03-22: 1 g via INTRAMUSCULAR

## 2013-03-22 MED ORDER — LIDOCAINE HCL (PF) 1 % IJ SOLN
INTRAMUSCULAR | Status: AC
  Filled 2013-03-22: qty 5

## 2013-03-22 MED ORDER — METRONIDAZOLE 500 MG PO TABS: 500.0000 mg | ORAL_TABLET | Freq: Two times a day (BID) | ORAL | Status: DC

## 2013-03-22 MED ORDER — DOXYCYCLINE HYCLATE 100 MG PO CAPS: 100.0000 mg | ORAL_CAPSULE | Freq: Two times a day (BID) | ORAL | Status: DC

## 2013-03-22 MED ORDER — CEFTRIAXONE SODIUM 1 G IJ SOLR
INTRAMUSCULAR | Status: AC
  Filled 2013-03-22: qty 10

## 2013-03-22 NOTE — ED Notes (Signed)
Reports vaginal pain and lower pelvic pain x 2 wks. Pt states that she is also having pain with intercourse. Low grade temp Pt denies vaginal discharge, n/v/d.

## 2013-03-22 NOTE — ED Provider Notes (Signed)
CSN: [id number]     Arrival date & time 03/22/13  0816 History     First MD Initiated Contact with Patient 03/22/13 [phone]     Chief Complaint  Patient presents with  . Vaginal Discharge    lower pelvic pain x 2 wk.s    (Consider location/radiation/quality/duration/timing/severity/associated sxs/prior Treatment) HPI Comments: History of present illness and review of systems obtained through the husband who is the Nurse, learning disability.   36 year old female presents complaining of 2 weeks of pelvic pain, dyspareunia, low-grade fever, dysuria, and back pain. The dyspareunia is with deep insertion and radiates up into the abdomen. The fever is subjective, not measured. She has never experienced anything like this before. She denies vaginal discharge, genital lesions, nausea, vomiting.  Patient is a 37 y.o. female presenting with vaginal discharge.  Vaginal Discharge Associated symptoms: abdominal pain, dyspareunia, dysuria and fever   Associated symptoms: no nausea and no vomiting     Past Medical History  Diagnosis Date  . Hypertension   . Diabetes mellitus without complication    Past Surgical History  Procedure Laterality Date  . Abdominal surgery     History reviewed. No pertinent family history. History  Substance Use Topics  . Smoking status: Never Smoker   . Smokeless tobacco: Never Used  . Alcohol Use: No   OB History   Grav Para Term Preterm Abortions TAB SAB Ect Mult Living                 Review of Systems  Constitutional: Positive for fever. Negative for chills.  Eyes: Negative for visual disturbance.  Respiratory: Negative for cough and shortness of breath.   Cardiovascular: Negative for chest pain, palpitations and leg swelling.  Gastrointestinal: Positive for abdominal pain. Negative for nausea, vomiting and diarrhea.  Endocrine: Negative for polydipsia and polyuria.  Genitourinary: Positive for dysuria, flank pain, vaginal pain, pelvic pain and dyspareunia. Negative  for urgency, frequency, hematuria, vaginal bleeding, vaginal discharge and genital sores.  Musculoskeletal: Negative for myalgias and arthralgias.  Skin: Negative for rash.  Neurological: Negative for dizziness, weakness and light-headedness.    Allergies  Quinine derivatives  Home Medications   Current Outpatient Rx  Name  Route  Sig  Dispense  Refill  . doxycycline (VIBRAMYCIN) 100 MG capsule   Oral   Take 1 capsule (100 mg total) by mouth 2 (two) times daily.   28 capsule   0   . Flexpen Starter Kit MISC   Other   1 kit by Other route once.         Marland Kitchen EXPIRED: insulin glargine (LANTUS) 100 UNIT/ML injection   Subcutaneous   Inject 15 Units into the skin at bedtime.   10 mL   3   . metroNIDAZOLE (FLAGYL) 500 MG tablet   Oral   Take 1 tablet (500 mg total) by mouth 2 (two) times daily.   28 tablet   0    BP 112/83  Pulse 74  Temp(Src) 97.6 F (36.4 C) (Oral)  Resp 16  SpO2 100%  LMP 03/08/2013 Physical Exam  Nursing note and vitals reviewed. Constitutional: She is oriented to person, place, and time. Vital signs are normal. She appears well-developed and well-nourished. No distress.  HENT:  Head: Normocephalic and atraumatic.  Cardiovascular: Normal rate, regular rhythm and normal heart sounds.  Exam reveals no gallop and no friction rub.   No murmur heard. Pulmonary/Chest: Effort normal and breath sounds normal. No respiratory distress. She has no wheezes. She has  no rales.  Abdominal: Soft. There is no hepatosplenomegaly. There is tenderness in the right lower quadrant, suprapubic area and left lower quadrant. There is CVA tenderness (mild, bilateral). There is no rigidity, no rebound, no guarding, no tenderness at McBurney's point and negative Murphy's sign.  Genitourinary: Pelvic exam was performed with patient supine. There is no tenderness or lesion on the right labia. There is no tenderness or lesion on the left labia. Cervix exhibits motion tenderness and  friability (There are multiple polyps noted at the cervix, firm). Cervix exhibits no discharge. Right adnexum displays tenderness. Right adnexum displays no fullness. Left adnexum displays tenderness. Left adnexum displays no fullness. No erythema around the vagina. No foreign body around the vagina. Vaginal discharge (clear, mucoid) found.  Neurological: She is alert and oriented to person, place, and time. She has normal strength.  Skin: Skin is warm and dry. She is not diaphoretic.  Psychiatric: She has a normal mood and affect. Her behavior is normal. Judgment normal.    ED Course   Procedures (including critical care time)  Labs Reviewed  POCT URINALYSIS DIP (DEVICE) - Abnormal; Notable for the following:    Hgb urine dipstick SMALL (*)    Leukocytes, UA TRACE (*)    All other components within normal limits  URINE CULTURE  POCT PREGNANCY, URINE  CERVICOVAGINAL ANCILLARY ONLY   No results found. 1. Pelvic pain   2. Dysuria     MDM  The urinalysis reveals possible UTI. Her exam is consistent with UTI, also with mild pelvic inflammatory disease. Treating for PID, I will have her followup with GYN for evaluation of the cervical polyps. These polyps are very firm and irregular. She has a normal Pap in one year ago which is reassuring but this needs to be repeated   Meds ordered this encounter  Medications  . cefTRIAXone (ROCEPHIN) injection 1 g    Sig:   . doxycycline (VIBRAMYCIN) 100 MG capsule    Sig: Take 1 capsule (100 mg total) by mouth 2 (two) times daily.    Dispense:  28 capsule    Refill:  0  . metroNIDAZOLE (FLAGYL) 500 MG tablet    Sig: Take 1 tablet (500 mg total) by mouth 2 (two) times daily.    Dispense:  28 tablet    Refill:  0     Graylon Good, PA-C 03/22/13 [phone]

## 2013-03-22 NOTE — ED Provider Notes (Signed)
Medical screening examination/treatment/procedure(s) were performed by non-physician practitioner and as supervising physician I was immediately available for consultation/collaboration.  Dionisia Pacholski, M.D.  Taheera Thomann C Macoy Rodwell, MD 03/22/13 1313 

## 2013-03-23 LAB — URINE CULTURE: Colony Count: 15000

## 2013-03-24 NOTE — ED Notes (Signed)
GC/Chlamydia neg., Affirm: Candida and Trich neg.,  Gardnerella pos., Urine culture: 15,000 colonies Multiple bacterial types present, none predominant.  Pt. adequately treated with Flagyl. Vassie Moselle 03/24/2013

## 2013-04-30 ENCOUNTER — Ambulatory Visit (INDEPENDENT_AMBULATORY_CARE_PROVIDER_SITE_OTHER): Payer: No Typology Code available for payment source | Admitting: Obstetrics and Gynecology

## 2013-04-30 ENCOUNTER — Encounter: Payer: Self-pay | Admitting: Obstetrics and Gynecology

## 2013-04-30 VITALS — BP 128/87 | HR 83 | Temp 98.0°F | Ht 63.5 in | Wt 179.8 lb

## 2013-04-30 DIAGNOSIS — N946 Dysmenorrhea, unspecified: Secondary | ICD-10-CM

## 2013-04-30 NOTE — Progress Notes (Signed)
  Subjective:    Patient ID: Dene Gentry, female    DOB: [birth year], 37 y.o.   MRN: [id number]  HPI 37 yo G3P3 presenting today for evaluation of pelvic pain. Patient reports pain has been present for several years. She has monthly cycles lasting 4 days. The pain is present during her menses, well managed with Motrin/tylenol. Patient denies abnormal discharge or dyspareunia.  Past Medical History  Diagnosis Date  . Hypertension   . Diabetes mellitus without complication    Past Surgical History  Procedure Laterality Date  . Abdominal surgery     History reviewed. No pertinent family history. History  Substance Use Topics  . Smoking status: Never Smoker   . Smokeless tobacco: Never Used  . Alcohol Use: No      Review of Systems  All other systems reviewed and are negative.       Objective:   Physical Exam  GENERAL: Well-developed, well-nourished female in no acute distress.  ABDOMEN: Soft, nontender, nondistended. Mild suprapubic pressure PELVIC: Normal external female genitalia. Vagina is pink and rugated.  Normal discharge. Normal appearing cervix. Uterus is normal in size. No adnexal mass or tenderness. EXTREMITIES: No cyanosis, clubbing, or edema, 2+ distal pulses.     Assessment & Plan:  37 yo with dysmenorrhea - wet prep collected - pelvic ultrasound ordered - Patient will be contacted with any abnormal results. - RTC prn

## 2013-05-01 LAB — WET PREP, GENITAL
Clue Cells Wet Prep HPF POC: NONE SEEN
WBC, Wet Prep HPF POC: NONE SEEN

## 2013-05-04 ENCOUNTER — Ambulatory Visit (HOSPITAL_COMMUNITY): Payer: No Typology Code available for payment source

## 2013-05-05 ENCOUNTER — Ambulatory Visit (HOSPITAL_COMMUNITY)
Admission: RE | Admit: 2013-05-05 | Discharge: 2013-05-05 | Disposition: A | Discharge status: Home / Self Care | Payer: No Typology Code available for payment source | Source: Ambulatory Visit | Attending: Obstetrics and Gynecology | Admitting: Obstetrics and Gynecology

## 2013-05-05 DIAGNOSIS — N949 Unspecified condition associated with female genital organs and menstrual cycle: Secondary | ICD-10-CM | POA: Insufficient documentation

## 2013-05-05 DIAGNOSIS — N946 Dysmenorrhea, unspecified: Secondary | ICD-10-CM | POA: Insufficient documentation

## 2013-05-10 ENCOUNTER — Telehealth: Payer: Self-pay | Admitting: Obstetrics and Gynecology

## 2013-05-10 NOTE — Telephone Encounter (Addendum)
Message copied by Toula Moos on Mon May 10, 2013  5:00 PM  ------ Called patient to give result below. No answer. Left message to RTC.       Message from: CONSTANT, PEGGY      Created: Wed May 05, 2013  3:11 PM       Please inform patient of normal ultrasound result which showed a normal size uterus and normal ovaries ------

## 2013-05-11 ENCOUNTER — Telehealth: Payer: Self-pay | Admitting: General Practice

## 2013-05-11 NOTE — Telephone Encounter (Signed)
Patient called up to front office returning our phone call, called patient right back with pacific interpreter [phone] and informed patient of normal ultrasound results; patient verbalized understanding and had no further questions

## 2013-05-11 NOTE — Telephone Encounter (Signed)
Opened in error

## 2013-05-11 NOTE — Telephone Encounter (Signed)
Called patient with pacific interpreter [phone], no answer- left message to call us back at the clinics

## 2013-05-11 NOTE — Telephone Encounter (Deleted)
Message copied by Kathee Delton on Tue May 11, 2013  9:36 AM ------      Message from: CONSTANT, PEGGY      Created: Wed May 05, 2013  3:11 PM       Please inform patient of normal ultrasound result which showed a normal size uterus and normal ovaries ------

## 2013-05-11 NOTE — Telephone Encounter (Deleted)
Patient's daughter called stating she was calling on behalf of her mom to get her test results

## 2013-05-12 ENCOUNTER — Encounter: Payer: Self-pay | Admitting: Internal Medicine

## 2013-05-12 ENCOUNTER — Ambulatory Visit: Payer: No Typology Code available for payment source | Attending: Internal Medicine | Admitting: Internal Medicine

## 2013-05-12 ENCOUNTER — Ambulatory Visit: Payer: No Typology Code available for payment source | Attending: Internal Medicine

## 2013-05-12 VITALS — BP 116/78 | HR 87 | Temp 98.7°F | Resp 16 | Ht 64.0 in | Wt 182.0 lb

## 2013-05-12 DIAGNOSIS — Z8489 Family history of other specified conditions: Secondary | ICD-10-CM | POA: Insufficient documentation

## 2013-05-12 DIAGNOSIS — E119 Type 2 diabetes mellitus without complications: Secondary | ICD-10-CM

## 2013-05-12 DIAGNOSIS — I10 Essential (primary) hypertension: Secondary | ICD-10-CM | POA: Insufficient documentation

## 2013-05-12 DIAGNOSIS — M2550 Pain in unspecified joint: Secondary | ICD-10-CM | POA: Insufficient documentation

## 2013-05-12 DIAGNOSIS — Z23 Encounter for immunization: Secondary | ICD-10-CM

## 2013-05-12 LAB — POCT GLYCOSYLATED HEMOGLOBIN (HGB A1C): Hemoglobin A1C: 5.1

## 2013-05-12 LAB — CBC WITH DIFFERENTIAL/PLATELET
Basophils Absolute: 0 10*3/uL (ref 0.0–0.1)
Eosinophils Relative: 3 % (ref 0–5)
Lymphocytes Relative: 41 % (ref 12–46)
Lymphs Abs: 3 10*3/uL (ref 0.7–4.0)
MCV: 82.5 fL (ref 78.0–100.0)
Neutrophils Relative %: 51 % (ref 43–77)
Platelets: 256 10*3/uL (ref 150–400)
RBC: 4.64 MIL/uL (ref 3.87–5.11)
RDW: 13.6 % (ref 11.5–15.5)
WBC: 7.2 10*3/uL (ref 4.0–10.5)

## 2013-05-12 LAB — COMPREHENSIVE METABOLIC PANEL
ALT: 11 U/L (ref 0–35)
AST: 14 U/L (ref 0–37)
Calcium: 8.9 mg/dL (ref 8.4–10.5)
Chloride: 106 mEq/L (ref 96–112)
Creat: 0.83 mg/dL (ref 0.50–1.10)
Sodium: 138 mEq/L (ref 135–145)
Total Bilirubin: 0.7 mg/dL (ref 0.3–1.2)
Total Protein: 6.6 g/dL (ref 6.0–8.3)

## 2013-05-12 LAB — LIPID PANEL
HDL: 48 mg/dL (ref >39)
LDL Cholesterol: 111 mg/dL — ABNORMAL HIGH (ref 0–99)
Triglycerides: 45 mg/dL (ref <150)
VLDL: 9 mg/dL (ref 0–40)

## 2013-05-12 LAB — GLUCOSE, POCT (MANUAL RESULT ENTRY): POC Glucose: 85 mg/dl (ref 70–99)

## 2013-05-12 MED ORDER — TRAMADOL HCL 50 MG PO TABS: 50.0000 mg | ORAL_TABLET | Freq: Three times a day (TID) | ORAL | Status: DC | PRN

## 2013-05-12 NOTE — Progress Notes (Signed)
Patient ID: Tanya [name], female   DOB: 1976/01/04, 37 y.o.   MRN: [id number]   CC:  Follow up  HPI: Patient is 37 year old female who presents to clinic for followup, would like to know she has diabetes. She reports being diagnosed with diabetes in the past and was on insulin but they took her off of it. She has not been on any medicine in a long time. She would also like to be tested for rheumatoid arthritis is discussed family history of rheumatoid arthritis. She reports bilateral wrist joint pain over several months, ankle joint pain and bilateral as well. She explains this is intermittent in nature, pain is throbbing and 5/10 in severity when present, worse in the morning.  Allergies  Allergen Reactions  . Quinine Derivatives Hives   Past Medical History  Diagnosis Date  . Hypertension   . Diabetes mellitus without complication    Current Outpatient Prescriptions on File Prior to Visit  Medication Sig Dispense Refill  . doxycycline (VIBRAMYCIN) 100 MG capsule Take 1 capsule (100 mg total) by mouth 2 (two) times daily.  28 capsule  0  . Flexpen Starter Kit MISC 1 kit by Other route once.      . insulin glargine (LANTUS) 100 UNIT/ML injection Inject 15 Units into the skin at bedtime.  10 mL  3  . metroNIDAZOLE (FLAGYL) 500 MG tablet Take 1 tablet (500 mg total) by mouth 2 (two) times daily.  28 tablet  0   No current facility-administered medications on file prior to visit.   Patient believes she has family history of rheumatoid arthritis History   Social History  . Marital Status: Married    Spouse Name: N/A    Number of Children: N/A  . Years of Education: N/A   Occupational History  . Not on file.   Social History Main Topics  . Smoking status: Never Smoker   . Smokeless tobacco: Never Used  . Alcohol Use: No  . Drug Use: No  . Sexual Activity: Yes    Birth Control/ Protection: None   Other Topics Concern  . Not on file   Social History Narrative  . No narrative  on file    Review of Systems  Constitutional: Negative for fever, chills, diaphoresis, activity change, appetite change and fatigue.  HENT: Negative for ear pain, nosebleeds, congestion, facial swelling, rhinorrhea, neck pain, neck stiffness and ear discharge.   Eyes: Negative for pain, discharge, redness, itching and visual disturbance.  Respiratory: Negative for cough, choking, chest tightness, shortness of breath, wheezing and stridor.   Cardiovascular: Negative for chest pain, palpitations and leg swelling.  Gastrointestinal: Negative for abdominal distention.  Genitourinary: Negative for dysuria, urgency, frequency, hematuria, flank pain, decreased urine volume, difficulty urinating and dyspareunia.  Musculoskeletal: Negative for back pain, and gait problem.  Neurological: Negative for dizziness, tremors, seizures, syncope, facial asymmetry, speech difficulty, weakness, light-headedness, numbness and headaches.  Hematological: Negative for adenopathy. Does not bruise/bleed easily.  Psychiatric/Behavioral: Negative for hallucinations, behavioral problems, confusion, dysphoric mood, decreased concentration and agitation.    Objective:   Filed Vitals:   05/12/13 0917  BP: 116/78  Pulse: 87  Temp: 98.7 F (37.1 C)  Resp: 16    Physical Exam  Constitutional: Appears well-developed and well-nourished. No distress.  HENT: Normocephalic. External right and left ear normal. Oropharynx is clear and moist.  Eyes: Conjunctivae and EOM are normal. PERRLA, no scleral icterus.  Neck: Normal ROM. Neck supple. No JVD. No tracheal deviation. No  thyromegaly.  CVS: RRR, S1/S2 +, no murmurs, no gallops, no carotid bruit.  Pulmonary: Effort and breath sounds normal, no stridor, rhonchi, wheezes, rales.  Abdominal: Soft. BS +,  no distension, tenderness, rebound or guarding.  Musculoskeletal: Normal range of motion. No edema and no tenderness.  Lymphadenopathy: No lymphadenopathy noted, cervical,  inguinal. Neuro: Alert. Normal reflexes, muscle tone coordination. No cranial nerve deficit. Skin: Skin is warm and dry. No rash noted. Not diaphoretic. No erythema. No pallor.  Psychiatric: Normal mood and affect. Behavior, judgment, thought content normal.   Lab Results  Component Value Date   WBC 8.5 10/21/2011   HGB 12.3 10/21/2011   HCT 36.9 10/21/2011   MCV 83.1 10/21/2011   PLT 181 10/21/2011   Lab Results  Component Value Date   CREATININE 0.68 10/21/2011   BUN 10 10/21/2011   NA 133* 10/21/2011   K 3.8 10/21/2011   CL 103 10/21/2011   CO2 20 10/21/2011    Lab Results  Component Value Date   HGBA1C 9.1* 10/20/2011   Lipid Panel  No results found for this basename: chol, trig, hdl, cholhdl, vldl, ldlcalc       Assessment and plan:   Patient Active Problem List   Diagnosis Date Noted  . Newly diagnosed diabetes - A1c is 5.8. CBGs 86. Patient does not need to be on any antihyperglycemic regimen. We will assess A1c in 3 months and make appropriate decision on management.  10/20/2011  .  bilateral ankle and wrist pain - no acute findings on physical exam today. I will check rheumatoid factor per patient preference. Prescribed tramadol as needed for symptom management  10/20/2011

## 2013-05-12 NOTE — Patient Instructions (Signed)
Wrist Exercises RANGE OF MOTION (ROM) AND STRETCHING EXERCISES - Wrist Sprain  These exercises may help you when beginning to rehabilitate your injury. Your symptoms may resolve with or without further involvement from your physician, physical therapist or athletic trainer. While completing these exercises, remember:   Restoring tissue flexibility helps normal motion to return to the joints. This allows healthier, less painful movement and activity.  An effective stretch should be held for at least 30 seconds.  A stretch should never be painful. You should only feel a gentle lengthening or release in the stretched tissue. RANGE OF MOTION  Wrist Flexion, Active-Assisted  Extend your right / left elbow with your palm pointing down.*  Gently pull the back of your hand towards you until you feel a gentle stretch on the top of your forearm.  Hold this position for __________ seconds. Repeat __________ times. Complete this exercise __________ times per day.  *If directed by your physician, physical therapist or athletic trainer, complete this stretch with your elbow bent rather than extended. RANGE OF MOTION  Wrist Extension, Active-Assisted   Extend your right / left elbow and turn your palm upwards.*  Gently pull your palm/fingertips back so your wrist extends and your fingers point more toward the ground.  You should feel a gentle stretch on the inside of your forearm.  Hold this position for __________ seconds. Repeat __________ times. Complete this exercise __________ times per day. *If directed by your physician, physical therapist or athletic trainer, complete this stretch with your elbow bent, rather than extended. RANGE OF MOTION  Supination, Active   Stand or sit with your elbows at your side. Bend your right / left elbow to 90 degrees.  Turn your palm upward until you feel a gentle stretch on the inside of your forearm.  Hold this position for __________ seconds. Slowly  release and return to the starting position. Repeat __________ times. Complete this stretch __________ times per day.  RANGE OF MOTION  Pronation, Active   Stand or sit with your elbows at your side. Bend your right / left elbow to 90 degrees.  Turn your palm downward until you feel a gentle stretch on the top of your forearm.  Hold this position for __________ seconds. Slowly release and return to the starting position. Repeat __________ times. Complete this stretch __________ times per day.  STRENGTHENING EXERCISES  These exercises may help you when beginning to rehabilitate your injury. They may resolve your symptoms with or without further involvement from your physician, physical therapist or athletic trainer. While completing these exercises, remember:   Muscles can gain both the endurance and the strength needed for everyday activities through controlled exercises.  Complete these exercises as instructed by your physician, physical therapist or athletic trainer. Progress the resistance and repetitions only as guided.  You may experience muscle soreness or fatigue, but the pain or discomfort you are trying to eliminate should never worsen during these exercises. If this pain does worsen, stop and make certain you are following the directions exactly. If the pain is still present after adjustments, discontinue the exercise until you can discuss the trouble with your clinician. STRENGTH Wrist Flexors  Sit with your right / left forearm palm-up and fully supported. Your elbow should be resting below the height of your shoulder. Allow your wrist to extend over the edge of the surface.  Loosely holding a __________ weight or a piece of rubber exercise band/tubing, slowly curl your hand up toward your forearm.    Hold this position for __________ seconds. Slowly lower the wrist back to the starting position in a controlled manner. Repeat __________ times. Complete this exercise __________  times per day.  STRENGTH  Wrist Extensors  Sit with your right / left forearm palm-down and fully supported. Your elbow should be resting below the height of your shoulder. Allow your wrist to extend over the edge of the surface.  Loosely holding a __________ weight or a piece of rubber exercise band/tubing, slowly curl your handup toward your forearm.  Hold this position for __________ seconds. Slowly lower the wrist back to the starting position in a controlled manner. Repeat __________ times. Complete this exercise __________ times per day.  STRENGTH  Forearm Supinators  Sit with your right / left forearm supported on a table, keeping your elbow below shoulder height. Rest your hand over the edge, palm down.  Gently grip a hammer or a soup ladle.  Without moving your elbow, slowly turn your palm and hand upward to a "thumbs-up" position.  Hold this position for __________ seconds. Slowly return to the starting position. Repeat __________ times. Complete this exercise __________ times per day.  STRENGTH  Forearm Pronators   Sit with your right / left forearm supported on a table, keeping your elbow below shoulder height. Rest your hand over the edge, palm up.  Gently grip a hammer or a soup ladle.  Without moving your elbow, slowly turn your palm and hand upward to a "thumbs-up" position.  Hold this position for __________ seconds. Slowly return to the starting position. Repeat __________ times. Complete this exercise __________ times per day.  STRENGTH - Grip  Grasp a tennis ball, a dense sponge, or a large, rolled sock in your hand.  Squeeze as hard as you can without increasing any pain.  Hold this position for __________ seconds. Release your grip slowly. Repeat __________ times. Complete this exercise __________ times per day.  Document Released: 06/19/2005 Document Revised: 10/28/2011 Document Reviewed: 11/17/2008 [facility]Exit[facility] Patient Information 2014 [place], [place].

## 2013-05-12 NOTE — Progress Notes (Signed)
Pt is here to establish care for Hx Htn,Diabetes. States she is no longer taking medications. C/o bilat leg pain with walking or long standing CBG last week [address] [place]. Translator present

## 2013-05-12 NOTE — Addendum Note (Signed)
Addended by: Nonnie Done D on: 05/12/2013 10:22 AM   Modules accepted: Orders

## 2013-08-13 ENCOUNTER — Ambulatory Visit: Payer: No Typology Code available for payment source

## 2013-08-16 ENCOUNTER — Ambulatory Visit: Payer: No Typology Code available for payment source | Admitting: Internal Medicine

## 2013-12-22 ENCOUNTER — Encounter: Payer: Self-pay | Admitting: Advanced Practice Midwife

## 2014-01-18 ENCOUNTER — Ambulatory Visit: Payer: BC Managed Care – PPO | Admitting: Advanced Practice Midwife

## 2014-02-01 ENCOUNTER — Ambulatory Visit (INDEPENDENT_AMBULATORY_CARE_PROVIDER_SITE_OTHER): Payer: BC Managed Care – PPO | Admitting: Advanced Practice Midwife

## 2014-02-01 ENCOUNTER — Encounter: Payer: Self-pay | Admitting: Advanced Practice Midwife

## 2014-02-01 VITALS — BP 129/85 | HR 93 | Temp 98.6°F | Wt 183.0 lb

## 2014-02-01 DIAGNOSIS — Z01419 Encounter for gynecological examination (general) (routine) without abnormal findings: Secondary | ICD-10-CM

## 2014-02-01 NOTE — Progress Notes (Signed)
  Subjective:     Tanya [name] is a 38 y.o. female and is here for a comprehensive physical exam. The patient reports no problems. Patient's method of BC is cycle/rythm method. Doing well declines STI screening. Had IUD removed 2-3 years ago due to complications or malposition. Does not desire other BCM at this time.  History   Social History  . Marital Status: Married    Spouse Name: N/A    Number of Children: N/A  . Years of Education: N/A   Occupational History  . Not on file.   Social History Main Topics  . Smoking status: Never Smoker   . Smokeless tobacco: Never Used  . Alcohol Use: No  . Drug Use: No  . Sexual Activity: Yes    Birth Control/ Protection: None   Other Topics Concern  . Not on file   Social History Narrative  . No narrative on file   Health Maintenance  Topic Date Due  . Tetanus/tdap  01/30/1994  . Influenza Vaccine  03/19/2014  . Pap Smear  09/09/2014    The following portions of the patient's history were reviewed and updated as appropriate: allergies, current medications, past family history, past medical history, past social history, past surgical history and problem list.  Review of Systems A comprehensive review of systems was negative.  Constitutional: negative for fatigue and weight loss Respiratory: negative for cough and wheezing Cardiovascular: negative for chest pain, fatigue and palpitations Gastrointestinal: negative for abdominal pain and change in bowel habits Genitourinary:negative Integument/breast: negative for nipple discharge Musculoskeletal:negative for myalgias Neurological: negative for gait problems and tremors Behavioral/Psych: negative for abusive relationship, depression Endocrine: negative for temperature intolerance       Objective:    BP 129/85  Pulse 93  Temp(Src) 98.6 F (37 C)  Wt 183 lb (83.008 kg)  LMP 01/24/2014 General appearance: alert and cooperative Head: Normocephalic, without obvious  abnormality, atraumatic Eyes: conjunctivae/corneas clear. PERRL, EOM's intact. Fundi benign. Nose: Nares normal. Septum midline. Mucosa normal. No drainage or sinus tenderness. Neck: no adenopathy, no carotid bruit, no JVD, supple, symmetrical, trachea midline and thyroid not enlarged, symmetric, no tenderness/mass/nodules Back: negative, symmetric, no curvature. ROM normal. No CVA tenderness. Lungs: clear to auscultation bilaterally Breasts: normal appearance, no masses or tenderness Heart: regular rate and rhythm, S1, S2 normal, no murmur, click, rub or gallop Abdomen: soft, non-tender; bowel sounds normal; no masses,  no organomegaly Pelvic: cervix normal in appearance, external genitalia normal, no adnexal masses or tenderness, no cervical motion tenderness, rectovaginal septum normal, uterus normal size, shape, and consistency and vagina normal without discharge Extremities: extremities normal, atraumatic, no cyanosis or edema Pulses: 2+ and symmetric Skin: Skin color, texture, turgor normal. No rashes or lesions Lymph nodes: Cervical, supraclavicular, and axillary nodes normal. Neurologic: Alert and oriented X 3, normal strength and tone. Normal symmetric reflexes. Normal coordination and gait    Assessment:    Healthy female exam.  Patient Active Problem List   Diagnosis Date Noted  . Newly diagnosed diabetes 10/20/2011  . DKA (diabetic ketoacidoses) 10/20/2011        Plan:    Pap smear done today, w/ HPV  Patient declined BCM, has regular MP and successful w/ rhythm method Plan initiation of mammogram @ 38 yo Diet and exercise information reviewed today.  Tanya [name] CNM

## 2014-02-05 LAB — PAP IG AND HPV HIGH-RISK: HPV DNA High Risk: NOT DETECTED

## 2014-06-20 ENCOUNTER — Encounter: Payer: Self-pay | Admitting: Advanced Practice Midwife

## 2014-07-06 ENCOUNTER — Emergency Department (HOSPITAL_COMMUNITY)
Admission: EM | Admit: 2014-07-06 | Discharge: 2014-07-06 | Disposition: A | Discharge status: Home / Self Care | Payer: BC Managed Care – PPO | Source: Home / Self Care | Attending: Family Medicine | Admitting: Family Medicine

## 2014-07-06 ENCOUNTER — Encounter (HOSPITAL_COMMUNITY): Payer: Self-pay | Admitting: Emergency Medicine

## 2014-07-06 DIAGNOSIS — J40 Bronchitis, not specified as acute or chronic: Secondary | ICD-10-CM

## 2014-07-06 MED ORDER — IBUPROFEN 800 MG PO TABS
800.0000 mg | ORAL_TABLET | Freq: Once | ORAL | Status: AC
  Administered 2014-07-06: 800 mg via ORAL

## 2014-07-06 MED ORDER — IPRATROPIUM-ALBUTEROL 0.5-2.5 (3) MG/3ML IN SOLN
RESPIRATORY_TRACT | Status: AC
  Filled 2014-07-06: qty 3

## 2014-07-06 MED ORDER — IPRATROPIUM BROMIDE 0.06 % NA SOLN: 2.0000 | Freq: Four times a day (QID) | NASAL | Status: DC

## 2014-07-06 MED ORDER — ALBUTEROL SULFATE HFA 108 (90 BASE) MCG/ACT IN AERS: 2.0000 | INHALATION_SPRAY | Freq: Four times a day (QID) | RESPIRATORY_TRACT | Status: DC | PRN

## 2014-07-06 MED ORDER — PREDNISONE 10 MG PO TABS: 30.0000 mg | ORAL_TABLET | Freq: Every day | ORAL | Status: DC

## 2014-07-06 MED ORDER — IPRATROPIUM-ALBUTEROL 0.5-2.5 (3) MG/3ML IN SOLN
3.0000 mL | Freq: Once | RESPIRATORY_TRACT | Status: AC
  Administered 2014-07-06: 3 mL via RESPIRATORY_TRACT

## 2014-07-06 MED ORDER — IBUPROFEN 800 MG PO TABS
ORAL_TABLET | ORAL | Status: AC
  Filled 2014-07-06: qty 1

## 2014-07-06 NOTE — ED Provider Notes (Addendum)
Tanya [name] is a 38 y.o. female who presents to Urgent Care today for Body aches and fever congestion coughing sore throat and mild shortness of breath. Symptoms present for about 4 days. No vomiting abdominal pain diarrhea chest pain or palpitations.   Past Medical History  Diagnosis Date  . Hypertension   . Diabetes mellitus without complication    Past Surgical History  Procedure Laterality Date  . Abdominal surgery     History  Substance Use Topics  . Smoking status: Never Smoker   . Smokeless tobacco: Never Used  . Alcohol Use: No   ROS as above Medications: No current facility-administered medications for this encounter.   Current Outpatient Prescriptions  Medication Sig Dispense Refill  . albuterol (PROVENTIL HFA;VENTOLIN HFA) 108 (90 BASE) MCG/ACT inhaler Inhale 2 puffs into the lungs every 6 (six) hours as needed for wheezing or shortness of breath. 1 Inhaler 2  . insulin glargine (LANTUS) 100 UNIT/ML injection Inject 15 Units into the skin at bedtime. 10 mL 3  . ipratropium (ATROVENT) 0.06 % nasal spray Place 2 sprays into both nostrils 4 (four) times daily. 15 mL 1  . predniSONE (DELTASONE) 10 MG tablet Take 3 tablets (30 mg total) by mouth daily. 15 tablet 0   Allergies  Allergen Reactions  . Quinine Derivatives Hives     Exam:  BP 120/81 mmHg  Pulse 94  Temp(Src) 99.1 F (37.3 C) (Oral)  Resp 16  SpO2 100%  LMP 07/01/2014  Oxygen saturation 100% on room air. Gen: Well NAD nontoxic appearing HEENT: EOMI,  MMM normal-appearing posterior pharynx and tympanic membranes Lungs: Normal work of breathing. Wheezing present with expiratory phase Heart: RRR no MRG Abd: NABS, Soft. Nondistended, Nontender Exts: Brisk capillary refill, warm and well perfused.   Patient was given 2.5/0.5 mg DuoNeb nebulizer treatment, and 800 mg of ibuprofen, and felt better  No results found for this or any previous visit (from the past 24 hour(s)). No results found.  Assessment  and Plan: 38 y.o. female with bronchitis with possible reactive airway disease component. Treatment with prednisone Atrovent nasal spray and albuterol. Recommend patient monitor her blood sugar carefully while taking prednisone. Follow-up with PCP.  Discussed warning signs or symptoms. Please see discharge instructions. Patient expresses understanding.     Rodolph BongEvan S Tahj Lindseth, MD 07/06/14 [id number]  Rodolph BongEvan S Akane Tessier, MD 07/06/14 [phone]

## 2014-07-06 NOTE — Discharge Instructions (Signed)
Thank you for coming in today. Use albuterol inhaler as needed.  Take prednisone daily for 5 days.  Monitor your blood sugar while taking prednisone as it will increase your sugar.  Use the nasal spray.  Call or go to the emergency room if you get worse, have trouble breathing, have chest pains, or palpitations.   Acute Bronchitis Bronchitis is inflammation of the airways that extend from the windpipe into the lungs (bronchi). The inflammation often causes mucus to develop. This leads to a cough, which is the most common symptom of bronchitis.  In acute bronchitis, the condition usually develops suddenly and goes away over time, usually in a couple weeks. Smoking, allergies, and asthma can make bronchitis worse. Repeated episodes of bronchitis may cause further lung problems.  CAUSES Acute bronchitis is most often caused by the same virus that causes a cold. The virus can spread from person to person (contagious) through coughing, sneezing, and touching contaminated objects. SIGNS AND SYMPTOMS   Cough.   Fever.   Coughing up mucus.   Body aches.   Chest congestion.   Chills.   Shortness of breath.   Sore throat.  DIAGNOSIS  Acute bronchitis is usually diagnosed through a physical exam. Your health care provider will also ask you questions about your medical history. Tests, such as chest X-rays, are sometimes done to rule out other conditions.  TREATMENT  Acute bronchitis usually goes away in a couple weeks. Oftentimes, no medical treatment is necessary. Medicines are sometimes given for relief of fever or cough. Antibiotic medicines are usually not needed but may be prescribed in certain situations. In some cases, an inhaler may be recommended to help reduce shortness of breath and control the cough. A cool mist vaporizer may also be used to help thin bronchial secretions and make it easier to clear the chest.  HOME CARE INSTRUCTIONS  Get plenty of rest.   Drink enough  fluids to keep your urine clear or pale yellow (unless you have a medical condition that requires fluid restriction). Increasing fluids may help thin your respiratory secretions (sputum) and reduce chest congestion, and it will prevent dehydration.   Take medicines only as directed by your health care provider.  If you were prescribed an antibiotic medicine, finish it all even if you start to feel better.  Avoid smoking and secondhand smoke. Exposure to cigarette smoke or irritating chemicals will make bronchitis worse. If you are a smoker, consider using nicotine gum or skin patches to help control withdrawal symptoms. Quitting smoking will help your lungs heal faster.   Reduce the chances of another bout of acute bronchitis by washing your hands frequently, avoiding people with cold symptoms, and trying not to touch your hands to your mouth, nose, or eyes.   Keep all follow-up visits as directed by your health care provider.  SEEK MEDICAL CARE IF: Your symptoms do not improve after 1 week of treatment.  SEEK IMMEDIATE MEDICAL CARE IF:  You develop an increased fever or chills.   You have chest pain.   You have severe shortness of breath.  You have bloody sputum.   You develop dehydration.  You faint or repeatedly feel like you are going to pass out.  You develop repeated vomiting.  You develop a severe headache. MAKE SURE YOU:   Understand these instructions.  Will watch your condition.  Will get help right away if you are not doing well or get worse. Document Released: 09/12/2004 Document Revised: 12/20/2013 Document Reviewed: 01/26/2013  ExitCare Patient Information 2015 [place]. This information is not intended to replace advice given to you by your health care provider. Make sure you discuss any questions you have with your health care provider.   Blood Glucose Monitoring Monitoring your blood glucose (also know as blood sugar) helps you to manage your  diabetes. It also helps you and your health care provider monitor your diabetes and determine how well your treatment plan is working. WHY SHOULD YOU MONITOR YOUR BLOOD GLUCOSE?  It can help you understand how food, exercise, and medicine affect your blood glucose.  It allows you to know what your blood glucose is at any given moment. You can quickly tell if you are having low blood glucose (hypoglycemia) or high blood glucose (hyperglycemia).  It can help you and your health care provider know how to adjust your medicines.  It can help you understand how to manage an illness or adjust medicine for exercise. WHEN SHOULD YOU TEST? Your health care provider will help you decide how often you should check your blood glucose. This may depend on the type of diabetes you have, your diabetes control, or the types of medicines you are taking. Be sure to write down all of your blood glucose readings so that this information can be reviewed with your health care provider. See below for examples of testing times that your health care provider may suggest. Type 1 Diabetes  Test 4 times a day if you are in good control, using an insulin pump, or perform multiple daily injections.  If your diabetes is not well controlled or if you are sick, you may need to monitor more often.  It is a good idea to also monitor:  Before and after exercise.  Between meals and 2 hours after a meal.  Occasionally between 2:00 a.m. and 3:00 a.m. Type 2 Diabetes  It can vary with each person, but generally, if you are on insulin, test 4 times a day.  If you take medicines by mouth (orally), test 2 times a day.  If you are on a controlled diet, test once a day.  If your diabetes is not well controlled or if you are sick, you may need to monitor more often. HOW TO MONITOR YOUR BLOOD GLUCOSE Supplies Needed  Blood glucose meter.  Test strips for your meter. Each meter has its own strips. You must use the strips that  go with your own meter.  A pricking needle (lancet).  A device that holds the lancet (lancing device).  A journal or log book to write down your results. Procedure  Wash your hands with soap and water. Alcohol is not preferred.  Prick the side of your finger (not the tip) with the lancet.  Gently milk the finger until a small drop of blood appears.  Follow the instructions that come with your meter for inserting the test strip, applying blood to the strip, and using your blood glucose meter. Other Areas to Get Blood for Testing Some meters allow you to use other areas of your body (other than your finger) to test your blood. These areas are called alternative sites. The most common alternative sites are:  The forearm.  The thigh.  The back area of the lower leg.  The palm of the hand. The blood flow in these areas is slower. Therefore, the blood glucose values you get may be delayed, and the numbers are different from what you would get from your fingers. Do not use alternative  sites if you think you are having hypoglycemia. Your reading will not be accurate. Always use a finger if you are having hypoglycemia. Also, if you cannot feel your lows (hypoglycemia unawareness), always use your fingers for your blood glucose checks. ADDITIONAL TIPS FOR GLUCOSE MONITORING  Do not reuse lancets.  Always carry your supplies with you.  All blood glucose meters have a 24-hour "hotline" number to call if you have questions or need help.  Adjust (calibrate) your blood glucose meter with a control solution after finishing a few boxes of strips. BLOOD GLUCOSE RECORD KEEPING It is a good idea to keep a daily record or log of your blood glucose readings. Most glucose meters, if not all, keep your glucose records stored in the meter. Some meters come with the ability to download your records to your home computer. Keeping a record of your blood glucose readings is especially helpful if you are  wanting to look for patterns. Make notes to go along with the blood glucose readings because you might forget what happened at that exact time. Keeping good records helps you and your health care provider to work together to achieve good diabetes management.  Document Released: 08/08/2003 Document Revised: 12/20/2013 Document Reviewed: 12/28/2012 [facility] Patient Information 2015 [place], [place]. This information is not intended to replace advice given to you by your health care provider. Make sure you discuss any questions you have with your health care provider.

## 2014-07-06 NOTE — ED Notes (Signed)
C/o cold sx onset 4 days Sx include: BA, wheezing, SOB, HA, fever, productive cough Denies v/d Alert, no signs of acute distress.

## 2014-09-17 ENCOUNTER — Emergency Department (HOSPITAL_COMMUNITY)
Admission: EM | Admit: 2014-09-17 | Discharge: 2014-09-17 | Disposition: A | Discharge status: Home / Self Care | Payer: BLUE CROSS/BLUE SHIELD | Source: Home / Self Care

## 2014-09-17 ENCOUNTER — Encounter (HOSPITAL_COMMUNITY): Payer: Self-pay | Admitting: Emergency Medicine

## 2014-09-17 DIAGNOSIS — H9202 Otalgia, left ear: Secondary | ICD-10-CM

## 2014-09-17 DIAGNOSIS — H6592 Unspecified nonsuppurative otitis media, left ear: Secondary | ICD-10-CM

## 2014-09-17 LAB — GLUCOSE, CAPILLARY: GLUCOSE-CAPILLARY: 82 mg/dL (ref 70–99)

## 2014-09-17 MED ORDER — CETIRIZINE-PSEUDOEPHEDRINE ER 5-120 MG PO TB12: 1.0000 | ORAL_TABLET | Freq: Every day | ORAL | Status: DC

## 2014-09-17 MED ORDER — IBUPROFEN 800 MG PO TABS
ORAL_TABLET | ORAL | Status: AC
  Filled 2014-09-17: qty 1

## 2014-09-17 MED ORDER — IBUPROFEN 800 MG PO TABS
800.0000 mg | ORAL_TABLET | Freq: Once | ORAL | Status: AC
  Administered 2014-09-17: 800 mg via ORAL

## 2014-09-17 NOTE — ED Provider Notes (Signed)
CSN: [id number]     Arrival date & time 09/17/14  1020 History   None    Chief Complaint  Patient presents with  . Otalgia   (Consider location/radiation/quality/duration/timing/severity/associated sxs/prior Treatment)  HPI   The patient is a 39 year old female presenting today with complaints of left ear pain x 1 month.  States was seen in ED 3 days ago, but that they "didn't do anything".   Past Medical History  Diagnosis Date  . Hypertension   . Diabetes mellitus without complication    Past Surgical History  Procedure Laterality Date  . Abdominal surgery     No family history on file. History  Substance Use Topics  . Smoking status: Never Smoker   . Smokeless tobacco: Never Used  . Alcohol Use: No   OB History    Gravida Para Term Preterm AB TAB SAB Ectopic Multiple Living   Review of Systems  Constitutional: Positive for fever.       The patient is reporting fever for several weeks, but points to pain scale and says her fever is at a 10.. Not sure husband or patient understand.  Denies chills or other systemic complaints.  States was seen in ED 3 days ago and prescribed nasal steroid, but otherwise "they didn't do anything".  Eyes: Negative.  Negative for photophobia, redness and visual disturbance.  Cardiovascular: Negative.   Gastrointestinal: Negative.   Endocrine: Negative.        States is diabetic, but not treating or taking any medication.   Genitourinary: Negative.   Musculoskeletal: Negative.   Allergic/Immunologic: Negative.   Neurological: Negative.  Negative for tremors, seizures, syncope, facial asymmetry, speech difficulty, weakness, light-headedness, numbness and headaches.  Hematological: Negative.   Psychiatric/Behavioral: Negative.     Allergies  Quinine derivatives  Home Medications   Prior to Admission medications   Medication Sig Start Date End Date Taking? Authorizing Provider  albuterol (PROVENTIL HFA;VENTOLIN HFA)  108 (90 BASE) MCG/ACT inhaler Inhale 2 puffs into the lungs every 6 (six) hours as needed for wheezing or shortness of breath. 07/06/14  Yes Rodolph Bong, MD  ipratropium (ATROVENT) 0.06 % nasal spray Place 2 sprays into both nostrils 4 (four) times daily. 07/06/14  Yes Rodolph Bong, MD  cetirizine-pseudoephedrine (ZYRTEC-D) 5-120 MG per tablet Take 1 tablet by mouth daily. 09/17/14   Servando Salina, NP  insulin glargine (LANTUS) 100 UNIT/ML injection Inject 15 Units into the skin at bedtime. 10/21/11 10/20/12  Belkys A Regalado, MD  predniSONE (DELTASONE) 10 MG tablet Take 3 tablets (30 mg total) by mouth daily. 07/06/14   Rodolph Bong, MD   BP 120/87 mmHg  Pulse 81  Temp(Src) 98.3 F (36.8 C) (Oral)  Resp 12  SpO2 100%  LMP 09/10/2014   Physical Exam  Constitutional: She is oriented to person, place, and time. She appears well-developed and well-nourished. No distress.  HENT:  Head: Normocephalic and atraumatic.  Left Ear: External ear normal.  Posterior oropharyngeal with moderate erythema present. Green mucoid discharge noted.  No evidence of exudate or patches. Mild anterior cervical lymphadenopathy present bilaterally. Serous otitis present bilaterally. Light reflexes significantly distorted on left and unable to visualize bony prominences secondary to fluid behind membrane. No evidence of dental caries. External ear canal has a small circular reddened area where it appears the patient may have been scratching, but patient denies any itching. Bilateral nasal turbinates  swollen and boggy in appearance. No popping or crepitus with movement of bilateral TMJs  Neck: Normal range of motion. Neck supple.  Negative nuchal rigidity. Patient ports discomfort in her left inner ear with movement of her head and neck to the left.  Cardiovascular: Normal rate, regular rhythm, normal heart sounds and intact distal pulses.  Exam reveals no gallop and no friction rub.   No murmur heard. Pulmonary/Chest:  Effort normal and breath sounds normal. No respiratory distress. She has no wheezes. She has no rales. She exhibits no tenderness.  Musculoskeletal: Normal range of motion. She exhibits no edema or tenderness.  Lymphadenopathy:    She has cervical adenopathy.  Neurological: She is alert and oriented to person, place, and time. No cranial nerve deficit.  Skin: Skin is warm and dry. No rash noted. She is not diaphoretic.  Nursing note and vitals reviewed.   ED Course  Procedures (including critical care time) Labs Review Labs Reviewed  GLUCOSE, CAPILLARY    Imaging Review No results found.   MDM   1. Ear pain, left   2. Left serous otitis media, recurrence not specified, unspecified chronicity    Meds ordered this encounter  Medications  . cetirizine-pseudoephedrine (ZYRTEC-D) 5-120 MG per tablet    Sig: Take 1 tablet by mouth daily.    Dispense:  15 tablet    Refill:  0  . ibuprofen (ADVIL,MOTRIN) tablet 800 mg    Sig:     The patient to continue nasal steroid and treat nasal and sinus congestion.  To follow up with PCP if symptoms fail to improve or worsen. The patient verbalizes understanding and agrees to plan of care.       Servando Salinaatherine H Analleli Gierke, NP 09/17/14 1521

## 2014-09-17 NOTE — ED Notes (Signed)
Via husband, [place] interpreter, pt C/o intermittent left ear pain onset 1 month Pain radiates to head and left side of neck Sx also include fevers.  Taking Atrovent nasal spray for congestion Alert, no signs of acute distress.

## 2014-09-17 NOTE — Discharge Instructions (Signed)
Continue to use the nasal spray for congestion.  Once you finish the prescription for zyrtec/sudafed you may obtain an over the counter supply of zyrtec from the [facility].  Take zyrtec at night before bed and nasal spray in the morning.   Otalgia The most common reason for this in children is an infection of the middle ear. Pain from the middle ear is usually caused by a build-up of fluid and pressure behind the eardrum. Pain from an earache can be sharp, dull, or burning. The pain may be temporary or constant. The middle ear is connected to the nasal passages by a short narrow tube called the Eustachian tube. The Eustachian tube allows fluid to drain out of the middle ear, and helps keep the pressure in your ear equalized. CAUSES  A cold or allergy can block the Eustachian tube with inflammation and the build-up of secretions. This is especially likely in small children, because their Eustachian tube is shorter and more horizontal. When the Eustachian tube closes, the normal flow of fluid from the middle ear is stopped. Fluid can accumulate and cause stuffiness, pain, hearing loss, and an ear infection if germs start growing in this area. SYMPTOMS  The symptoms of an ear infection may include fever, ear pain, fussiness, increased crying, and irritability. Many children will have temporary and minor hearing loss during and right after an ear infection. Permanent hearing loss is rare, but the risk increases the more infections a child has. Other causes of ear pain include retained water in the outer ear canal from swimming and bathing. Ear pain in adults is less likely to be from an ear infection. Ear pain may be referred from other locations. Referred pain may be from the joint between your jaw and the skull. It may also come from a tooth problem or problems in the neck. Other causes of ear pain include:  A foreign body in the ear.  Outer ear infection.  Sinus  infections.  Impacted ear wax.  Ear injury.  Arthritis of the jaw or TMJ problems.  Middle ear infection.  Tooth infections.  Sore throat with pain to the ears. DIAGNOSIS  Your caregiver can usually make the diagnosis by examining you. Sometimes other special studies, including x-rays and lab work may be necessary. TREATMENT   If antibiotics were prescribed, use them as directed and finish them even if you or your child's symptoms seem to be improved.  Sometimes PE tubes are needed in children. These are little plastic tubes which are put into the eardrum during a simple surgical procedure. They allow fluid to drain easier and allow the pressure in the middle ear to equalize. This helps relieve the ear pain caused by pressure changes. HOME CARE INSTRUCTIONS   Only take over-the-counter or prescription medicines for pain, discomfort, or fever as directed by your caregiver. DO NOT GIVE CHILDREN ASPIRIN because of the association of Reye's Syndrome in children taking aspirin.  Use a cold pack applied to the outer ear for 15-20 minutes, 03-04 times per day or as needed may reduce pain. Do not apply ice directly to the skin. You may cause frost bite.  Over-the-counter ear drops used as directed may be effective. Your caregiver may sometimes prescribe ear drops.  Resting in an upright position may help reduce pressure in the middle ear and relieve pain.  Ear pain caused by rapidly descending from high altitudes can be relieved by swallowing or chewing gum. Allowing infants to suck on  a bottle during airplane travel can help.  Do not smoke in the house or near children. If you are unable to quit smoking, smoke outside.  Control allergies. SEEK IMMEDIATE MEDICAL CARE IF:   You or your child are becoming sicker.  Pain or fever relief is not obtained with medicine.  You or your child's symptoms (pain, fever, or irritability) do not improve within 24 to 48 hours or as  instructed.  Severe pain suddenly stops hurting. This may indicate a ruptured eardrum.  You or your children develop new problems such as severe headaches, stiff neck, difficulty swallowing, or swelling of the face or around the ear. Document Released: 03/22/2004 Document Revised: 10/28/2011 Document Reviewed: 07/27/2008 [facility] Patient Information 2015 [place], [place]. This information is not intended to replace advice given to you by your health care provider. Make sure you discuss any questions you have with your health care provider.

## 2016-01-23 ENCOUNTER — Emergency Department (HOSPITAL_COMMUNITY): Payer: BLUE CROSS/BLUE SHIELD

## 2016-01-23 ENCOUNTER — Encounter (HOSPITAL_COMMUNITY): Payer: Self-pay | Admitting: *Deleted

## 2016-01-23 ENCOUNTER — Encounter (HOSPITAL_COMMUNITY): Payer: Self-pay | Admitting: Emergency Medicine

## 2016-01-23 ENCOUNTER — Emergency Department (HOSPITAL_COMMUNITY)
Admission: EM | Admit: 2016-01-23 | Discharge: 2016-01-24 | Disposition: A | Discharge status: Home / Self Care | Payer: BLUE CROSS/BLUE SHIELD | Attending: Emergency Medicine | Admitting: Emergency Medicine

## 2016-01-23 ENCOUNTER — Ambulatory Visit (HOSPITAL_COMMUNITY)
Admission: EM | Admit: 2016-01-23 | Discharge: 2016-01-23 | Disposition: A | Discharge status: Nursing Home (Includes ICF) | Payer: BLUE CROSS/BLUE SHIELD | Source: Home / Self Care | Attending: Emergency Medicine | Admitting: Emergency Medicine

## 2016-01-23 DIAGNOSIS — R319 Hematuria, unspecified: Secondary | ICD-10-CM

## 2016-01-23 DIAGNOSIS — I10 Essential (primary) hypertension: Secondary | ICD-10-CM | POA: Insufficient documentation

## 2016-01-23 DIAGNOSIS — Z794 Long term (current) use of insulin: Secondary | ICD-10-CM | POA: Diagnosis not present

## 2016-01-23 DIAGNOSIS — E119 Type 2 diabetes mellitus without complications: Secondary | ICD-10-CM | POA: Diagnosis not present

## 2016-01-23 DIAGNOSIS — R1031 Right lower quadrant pain: Secondary | ICD-10-CM | POA: Diagnosis not present

## 2016-01-23 LAB — POCT URINALYSIS DIP (DEVICE)
Bilirubin Urine: NEGATIVE
Glucose, UA: NEGATIVE mg/dL
KETONES UR: NEGATIVE mg/dL
Nitrite: NEGATIVE
Protein, ur: 100 mg/dL — AB
SPECIFIC GRAVITY, URINE: 1.02 (ref 1.005–1.030)
UROBILINOGEN UA: 0.2 mg/dL (ref 0.0–1.0)
pH: 8.5 — ABNORMAL HIGH (ref 5.0–8.0)

## 2016-01-23 LAB — LIPASE, BLOOD: Lipase: 20 U/L (ref 11–51)

## 2016-01-23 LAB — COMPREHENSIVE METABOLIC PANEL
ALBUMIN: 3.7 g/dL (ref 3.5–5.0)
ALT: 24 U/L (ref 14–54)
AST: 27 U/L (ref 15–41)
Alkaline Phosphatase: 76 U/L (ref 38–126)
Anion gap: 7 (ref 5–15)
BILIRUBIN TOTAL: 0.6 mg/dL (ref 0.3–1.2)
BUN: 7 mg/dL (ref 6–20)
CO2: 22 mmol/L (ref 22–32)
CREATININE: 0.81 mg/dL (ref 0.44–1.00)
Calcium: 9.2 mg/dL (ref 8.9–10.3)
Chloride: 108 mmol/L (ref 101–111)
GFR calc Af Amer: >60 mL/min (ref >60)
GLUCOSE: 103 mg/dL — AB (ref 65–99)
POTASSIUM: 3.8 mmol/L (ref 3.5–5.1)
Sodium: 137 mmol/L (ref 135–145)
TOTAL PROTEIN: 7.2 g/dL (ref 6.5–8.1)

## 2016-01-23 LAB — WET PREP, GENITAL
Sperm: NONE SEEN
Trich, Wet Prep: NONE SEEN
Yeast Wet Prep HPF POC: NONE SEEN

## 2016-01-23 LAB — CBC
HCT: 40.4 % (ref 36.0–46.0)
Hemoglobin: 12.5 g/dL (ref 12.0–15.0)
MCH: 25 pg — AB (ref 26.0–34.0)
MCHC: 30.9 g/dL (ref 30.0–36.0)
MCV: 80.8 fL (ref 78.0–100.0)
Platelets: 279 10*3/uL (ref 150–400)
RBC: 5 MIL/uL (ref 3.87–5.11)
RDW: 13.9 % (ref 11.5–15.5)
WBC: 7.4 10*3/uL (ref 4.0–10.5)

## 2016-01-23 LAB — POCT PREGNANCY, URINE: PREG TEST UR: NEGATIVE

## 2016-01-23 MED ORDER — HYDROCODONE-ACETAMINOPHEN 5-325 MG PO TABS
ORAL_TABLET | ORAL | Status: AC
  Filled 2016-01-23: qty 1

## 2016-01-23 MED ORDER — MORPHINE SULFATE (PF) 4 MG/ML IV SOLN
4.0000 mg | Freq: Once | INTRAVENOUS | Status: AC
  Administered 2016-01-23: 4 mg via INTRAMUSCULAR
  Filled 2016-01-23: qty 1

## 2016-01-23 MED ORDER — HYDROCODONE-ACETAMINOPHEN 5-325 MG PO TABS
1.0000 | ORAL_TABLET | Freq: Once | ORAL | Status: AC
Start: 2016-01-24 — End: 2016-01-24
  Administered 2016-01-24: 1 via ORAL
  Filled 2016-01-23: qty 1

## 2016-01-23 MED ORDER — HYDROCODONE-ACETAMINOPHEN 5-325 MG PO TABS: 1.0000 | ORAL_TABLET | ORAL | Status: DC | PRN

## 2016-01-23 MED ORDER — HYDROCODONE-ACETAMINOPHEN 5-325 MG PO TABS
1.0000 | ORAL_TABLET | Freq: Once | ORAL | Status: AC
  Administered 2016-01-23: 1 via ORAL

## 2016-01-23 NOTE — Discharge Instructions (Signed)

## 2016-01-23 NOTE — ED Notes (Addendum)
Pt is here with right lower quad pain since last night and blood in urine.  See note.  Sent to r/o several things including appendicitis.  Given Vicodin at urgent care

## 2016-01-23 NOTE — ED Notes (Signed)
Pt has been suffering from RLQ pain since last night.  She also has blood in her urine that started last night.  She denies any fever.

## 2016-01-23 NOTE — ED Provider Notes (Signed)
CSN: [id number]     Arrival date & time 01/23/16  1510 History   First MD Initiated Contact with Patient 01/23/16 1549     Chief Complaint  Patient presents with  . Abdominal Pain   (Consider location/radiation/quality/duration/timing/severity/associated sxs/prior Treatment) HPI She is a 40 year old woman here for evaluation of right lower quadrant abdominal pain. Her son is present and access interpreter when necessary. Pain started suddenly last night. She states she rolled over in bed and had acute onset of sharp pain in her right lower quadrant. This pain has been persistent and is getting worse. She has also noticed some blood in her urine. Pain seems to get worse with urination. She denies any nausea or vomiting. No diarrhea. No fevers. No history of appendectomy. She has not taken any medications.  Past Medical History  Diagnosis Date  . Hypertension   . Diabetes mellitus without complication [facility](HCC)    Past Surgical History  Procedure Laterality Date  . Abdominal surgery     History reviewed. No pertinent family history. Social History  Substance Use Topics  . Smoking status: Never Smoker   . Smokeless tobacco: Never Used  . Alcohol Use: No   OB History    Gravida Para Term Preterm AB TAB SAB Ectopic Multiple Living   3 3 3       3      Review of Systems As in history of present illness Allergies  Quinine derivatives  Home Medications   Prior to Admission medications   Medication Sig Start Date End Date Taking? Authorizing Provider  albuterol (PROVENTIL HFA;VENTOLIN HFA) 108 (90 BASE) MCG/ACT inhaler Inhale 2 puffs into the lungs every 6 (six) hours as needed for wheezing or shortness of breath. 07/06/14   Rodolph BongEvan S Corey, MD  cetirizine-pseudoephedrine (ZYRTEC-D) 5-120 MG per tablet Take 1 tablet by mouth daily. 09/17/14   Servando Salinaatherine H Rossi, NP  insulin glargine (LANTUS) 100 UNIT/ML injection Inject 15 Units into the skin at bedtime. 10/21/11 10/20/12  Belkys A Regalado, MD   ipratropium (ATROVENT) 0.06 % nasal spray Place 2 sprays into both nostrils 4 (four) times daily. 07/06/14   Rodolph BongEvan S Corey, MD  predniSONE (DELTASONE) 10 MG tablet Take 3 tablets (30 mg total) by mouth daily. 07/06/14   Rodolph BongEvan S Corey, MD   Meds Ordered and Administered this Visit   Medications  HYDROcodone-acetaminophen (NORCO/VICODIN) 5-325 MG per tablet 1 tablet (1 tablet Oral Given 01/23/16 1623)    BP 122/88 mmHg  Pulse 82  Temp(Src) 98.8 F (37.1 C) (Oral)  Resp 16  SpO2 100% No data found.   Physical Exam  Constitutional: She is oriented to person, place, and time. She appears well-developed and well-nourished. She appears distressed (she appears uncomfortable. It is quite painful for her to maneuver onto and off of the exam table.).  Cardiovascular: Normal rate, regular rhythm and normal heart sounds.   No murmur heard. Pulmonary/Chest: Effort normal and breath sounds normal. No respiratory distress. She has no wheezes. She has no rales.  Abdominal: Soft. She exhibits no distension and no mass. There is tenderness (Focal in right lower quadrant. No suprapubic tenderness.). There is no rebound and no guarding.  Neurological: She is alert and oriented to person, place, and time.    ED Course  Procedures (including critical care time)  Labs Review Labs Reviewed  POCT URINALYSIS DIP (DEVICE) - Abnormal; Notable for the following:    Hgb urine dipstick LARGE (*)    pH 8.5 (*)  Protein, ur 100 (*)    Leukocytes, UA SMALL (*)    All other components within normal limits  URINE CULTURE  POCT PREGNANCY, URINE    Imaging Review No results found.   MDM   1. RLQ abdominal pain   2. Hematuria    Differential includes appendicitis, kidney stone, ovarian etiology, and UTI. I have sent her urine for culture. I also gave her Vicodin. Given her discomfort and the differential, will transfer to [facility] ED via shuttle for additional evaluation.    Charm Rings,  MD 01/23/16 [phone]

## 2016-01-23 NOTE — ED Notes (Signed)
Pt being transferred to ED via shuttle for further evaluation for RLQ pain.  Report was called to the ED First RN, Lawson FiscalLori.  Pt is A&O w/ VSS.  Pt is very guarded with her pain.

## 2016-01-24 LAB — GC/CHLAMYDIA PROBE AMP (~~LOC~~) NOT AT ARMC
Chlamydia: NEGATIVE
NEISSERIA GONORRHEA: NEGATIVE

## 2016-01-25 LAB — URINE CULTURE

## 2016-01-30 NOTE — ED Provider Notes (Signed)
CSN: [id number]     Arrival date & time 01/23/16  1635 History   First MD Initiated Contact with Patient 01/23/16 2004     Chief Complaint  Patient presents with  . Abdominal Pain     (Consider location/radiation/quality/duration/timing/severity/associated sxs/prior Treatment) HPI Comments: Patient with history of DM, HTN presents from Urgent Care with complaint of RLQ abdominal pain, sudden onset while in bed last night. No fever. She denies nausea, vomiting, change in bowel movements. She continues to have an appetite. She has mild dysuria and reports she might have seen some blood when urinating. No vaginal discharge or history of ovarian cysts. She was sent to the ED for further evaluation of appendicitis vs ovarian or pelvic source of pain. She was given a Vicodin prior to arrival and reports her pain continues.   Patient is a 40 y.o. female presenting with abdominal pain. The history is provided by the patient and a relative. Language interpreter used: Language interpreter is family at bedside.   Abdominal Pain Pain location:  RLQ Associated symptoms: dysuria and hematuria   Associated symptoms: no chest pain, no chills, no cough, no fever, no nausea, no shortness of breath, no vaginal bleeding, no vaginal discharge and no vomiting     Past Medical History  Diagnosis Date  . Hypertension   . Diabetes mellitus without complication [facility](HCC)    Past Surgical History  Procedure Laterality Date  . Abdominal surgery     No family history on file. Social History  Substance Use Topics  . Smoking status: Never Smoker   . Smokeless tobacco: Never Used  . Alcohol Use: No   OB History    Gravida Para Term Preterm AB TAB SAB Ectopic Multiple Living   3 3 3       3      Review of Systems  Constitutional: Negative for fever and chills.  Respiratory: Negative.  Negative for cough and shortness of breath.   Cardiovascular: Negative.  Negative for chest pain.  Gastrointestinal: Positive  for abdominal pain. Negative for nausea and vomiting.  Genitourinary: Positive for dysuria and hematuria. Negative for vaginal bleeding and vaginal discharge.  Musculoskeletal: Negative.  Negative for myalgias.  Neurological: Negative.       Allergies  Quinine derivatives  Home Medications   Prior to Admission medications   Medication Sig Start Date End Date Taking? Authorizing Provider  cetirizine-pseudoephedrine (ZYRTEC-D) 5-120 MG per tablet Take 1 tablet by mouth daily. Patient taking differently: Take 1 tablet by mouth daily as needed for allergies or rhinitis.  09/17/14  Yes Servando Salinaatherine H Rossi, NP  insulin glargine (LANTUS) 100 UNIT/ML injection Inject 15 Units into the skin at bedtime. 10/21/11 01/23/16 Yes Belkys A Regalado, MD  albuterol (PROVENTIL HFA;VENTOLIN HFA) 108 (90 BASE) MCG/ACT inhaler Inhale 2 puffs into the lungs every 6 (six) hours as needed for wheezing or shortness of breath. 07/06/14   Rodolph BongEvan S Corey, MD  HYDROcodone-acetaminophen (NORCO/VICODIN) 5-325 MG tablet Take 1-2 tablets by mouth every 4 (four) hours as needed. 01/23/16   Elpidio AnisShari Kaziyah Parkison, PA-C  ipratropium (ATROVENT) 0.06 % nasal spray Place 2 sprays into both nostrils 4 (four) times daily. 07/06/14   Rodolph BongEvan S Corey, MD  predniSONE (DELTASONE) 10 MG tablet Take 3 tablets (30 mg total) by mouth daily. 07/06/14   Rodolph BongEvan S Corey, MD   BP 108/75 mmHg  Pulse 79  Temp(Src) 98.4 F (36.9 C) (Oral)  Resp 18  SpO2 100%  LMP  Physical Exam  Constitutional: She  is oriented to person, place, and time. She appears well-developed and well-nourished. No distress.  HENT:  Mouth/Throat: Oropharynx is clear and moist.  Cardiovascular: Normal rate and regular rhythm.   No murmur heard. Pulmonary/Chest: Effort normal and breath sounds normal. She has no wheezes. She has no rales. She exhibits no tenderness.  Abdominal: Soft. There is tenderness (Tender to RLQ without gurarding. Negative rovsing's. No peritoneal signs. ).   Musculoskeletal: Normal range of motion.  Neurological: She is alert and oriented to person, place, and time.  Skin: Skin is warm and dry.  Psychiatric: She has a normal mood and affect.    ED Course  Procedures (including critical care time) Labs Review Labs Reviewed  WET PREP, GENITAL - Abnormal; Notable for the following:    Clue Cells Wet Prep HPF POC PRESENT (*)    WBC, Wet Prep HPF POC MODERATE (*)    All other components within normal limits  COMPREHENSIVE METABOLIC PANEL - Abnormal; Notable for the following:    Glucose, Bld 103 (*)    All other components within normal limits  CBC - Abnormal; Notable for the following:    MCH 25.0 (*)    All other components within normal limits  LIPASE, BLOOD  GC/CHLAMYDIA PROBE AMP ([place]) NOT AT [facility]   Results for orders placed or performed during the hospital encounter of 01/23/16  Wet prep, genital  Result Value Ref Range   Yeast Wet Prep HPF POC NONE SEEN NONE SEEN   Trich, Wet Prep NONE SEEN NONE SEEN   Clue Cells Wet Prep HPF POC PRESENT (A) NONE SEEN   WBC, Wet Prep HPF POC MODERATE (A) NONE SEEN   Sperm NONE SEEN   Lipase, blood  Result Value Ref Range   Lipase 20 11 - 51 U/L  Comprehensive metabolic panel  Result Value Ref Range   Sodium 137 135 - 145 mmol/L   Potassium 3.8 3.5 - 5.1 mmol/L   Chloride 108 101 - 111 mmol/L   CO2 22 22 - 32 mmol/L   Glucose, Bld 103 (H) 65 - 99 mg/dL   BUN 7 6 - 20 mg/dL   Creatinine, Ser 1.47 0.44 - 1.00 mg/dL   Calcium 9.2 8.9 - 82.9 mg/dL   Total Protein 7.2 6.5 - 8.1 g/dL   Albumin 3.7 3.5 - 5.0 g/dL   AST 27 15 - 41 U/L   ALT 24 14 - 54 U/L   Alkaline Phosphatase 76 38 - 126 U/L   Total Bilirubin 0.6 0.3 - 1.2 mg/dL   GFR calc non Af Amer >60 >60 mL/min   GFR calc Af Amer >60 >60 mL/min   Anion gap 7 5 - 15  CBC  Result Value Ref Range   WBC 7.4 4.0 - 10.5 K/uL   RBC 5.00 3.87 - 5.11 MIL/uL   Hemoglobin 12.5 12.0 - 15.0 g/dL   HCT 56.2 13.0 - 86.5 %   MCV 80.8  78.0 - 100.0 fL   MCH 25.0 (L) 26.0 - 34.0 pg   MCHC 30.9 30.0 - 36.0 g/dL   RDW 78.4 69.6 - 29.5 %   Platelets 279 150 - 400 K/uL  GC/Chlamydia probe amp  Result Value Ref Range   Chlamydia Negative    Neisseria gonorrhea Negative    US Transvaginal Non-ob  01/23/2016  CLINICAL DATA:  Initial evaluation for acute right adnexal pain. EXAM: TRANSABDOMINAL AND TRANSVAGINAL ULTRASOUND OF PELVIS TECHNIQUE: Both transabdominal and transvaginal ultrasound examinations of the pelvis  were performed. Transabdominal technique was performed for global imaging of the pelvis including uterus, ovaries, adnexal regions, and pelvic cul-de-sac. It was necessary to proceed with endovaginal exam following the transabdominal exam to visualize the uterus and ovaries. COMPARISON:  prior CT from earlier the same day as well as previous ultrasound from 05/05/2013 FINDINGS: Uterus Measurements: 9.0 x 4.3 x 4.4 cm. No fibroids or other mass visualized. Endometrium Thickness: 4 mm.  No focal abnormality visualized. Right ovary Measurements: 2.1 x 1.0 x 2.2 cm. Normal appearance/no adnexal mass. Left ovary Measurements: 1.6 x 1.1 x 1.7 cm. Normal appearance/no adnexal mass. Other findings No abnormal free fluid. IMPRESSION: Normal pelvic ultrasound.  No acute pathology identified. Electronically Signed   By: Rise Mu M.D.   On: 01/23/2016 23:34   US Pelvis Complete  01/23/2016  CLINICAL DATA:  Initial evaluation for acute right adnexal pain. EXAM: TRANSABDOMINAL AND TRANSVAGINAL ULTRASOUND OF PELVIS TECHNIQUE: Both transabdominal and transvaginal ultrasound examinations of the pelvis were performed. Transabdominal technique was performed for global imaging of the pelvis including uterus, ovaries, adnexal regions, and pelvic cul-de-sac. It was necessary to proceed with endovaginal exam following the transabdominal exam to visualize the uterus and ovaries. COMPARISON:  prior CT from earlier the same day as well as  previous ultrasound from 05/05/2013 FINDINGS: Uterus Measurements: 9.0 x 4.3 x 4.4 cm. No fibroids or other mass visualized. Endometrium Thickness: 4 mm.  No focal abnormality visualized. Right ovary Measurements: 2.1 x 1.0 x 2.2 cm. Normal appearance/no adnexal mass. Left ovary Measurements: 1.6 x 1.1 x 1.7 cm. Normal appearance/no adnexal mass. Other findings No abnormal free fluid. IMPRESSION: Normal pelvic ultrasound.  No acute pathology identified. Electronically Signed   By: Rise Mu M.D.   On: 01/23/2016 23:34   Ct Renal Stone Study  01/23/2016  CLINICAL DATA:  40 year old female with right lower quadrant abdominal pain and hematuria. EXAM: CT ABDOMEN AND PELVIS WITHOUT CONTRAST TECHNIQUE: Multidetector CT imaging of the abdomen and pelvis was performed following the standard protocol without IV contrast. COMPARISON:  None. FINDINGS: Evaluation of this exam is limited in the absence of intravenous contrast. The visualized lung bases are clear. No intra-abdominal free air or free fluid identified. The liver, gallbladder pancreas, spleen, and adrenal glands appear unremarkable. There is a 12 mm stone in the central upper pole collecting of the left kidney. There is mild focal hydronephrosis of left renal upper pole collecting system versus a parapelvic cyst. The right kidney is unremarkable with the visualized ureters and urinary bladder are grossly unremarkable. The uterus is anteverted and grossly unremarkable. There is no evidence of bowel obstruction or active inflammation. Normal appendix. The abdominal aorta and IVC are grossly unremarkable on this noncontrast study. No portal venous gas identified. There is no adenopathy. The abdominal wall soft tissues appear unremarkable. There is a small fat containing umbilical hernia. The osseous structures are intact. IMPRESSION: A 12 mm left renal calculus with focal mild upper pole hydronephrosis. No other acute intra-abdominal pelvic pathology  identified. Electronically Signed   By: Elgie Collard M.D.   On: 01/23/2016 20:58     Imaging Review No results found. I have personally reviewed and evaluated these images and lab results as part of my medical decision-making.   EKG Interpretation None      MDM   Final diagnoses:  RLQ abdominal pain    Patient arrives from Urgent Care with concern for appendicitis vs. Ovarian cyst for further evaluation. Pain managed. Labs reviewed. CT renal  study ordered given history of hematuria and location of pain. Negative for stones. Appendix visualized on this study and found normal. US performed to evaluate for pelvic source. Negative for abnormal findings. She is stable, comfortable and able to follow up outpatient with her primary care physician. Patient and family are comfortable with discharge home.     Elpidio Anis, PA-C 01/30/16 2055  Rolan Bucco, MD 02/03/16 [phone]

## 2016-08-09 ENCOUNTER — Encounter (HOSPITAL_COMMUNITY): Payer: Self-pay | Admitting: Family Medicine

## 2016-08-09 ENCOUNTER — Ambulatory Visit (HOSPITAL_COMMUNITY)
Admission: EM | Admit: 2016-08-09 | Discharge: 2016-08-09 | Disposition: A | Discharge status: Home / Self Care | Payer: BLUE CROSS/BLUE SHIELD | Attending: Family Medicine | Admitting: Family Medicine

## 2016-08-09 ENCOUNTER — Ambulatory Visit (INDEPENDENT_AMBULATORY_CARE_PROVIDER_SITE_OTHER): Payer: BLUE CROSS/BLUE SHIELD

## 2016-08-09 DIAGNOSIS — R69 Illness, unspecified: Secondary | ICD-10-CM | POA: Diagnosis not present

## 2016-08-09 DIAGNOSIS — J111 Influenza due to unidentified influenza virus with other respiratory manifestations: Secondary | ICD-10-CM

## 2016-08-09 MED ORDER — KETOROLAC TROMETHAMINE 30 MG/ML IJ SOLN
INTRAMUSCULAR | Status: AC
  Filled 2016-08-09: qty 1

## 2016-08-09 MED ORDER — KETOROLAC TROMETHAMINE 30 MG/ML IJ SOLN: 30.0000 mg | Freq: Once | INTRAMUSCULAR | Status: DC

## 2016-08-09 MED ORDER — ONDANSETRON 4 MG PO TBDP
ORAL_TABLET | ORAL | Status: AC
  Filled 2016-08-09: qty 1

## 2016-08-09 MED ORDER — KETOROLAC TROMETHAMINE 30 MG/ML IJ SOLN
30.0000 mg | Freq: Once | INTRAMUSCULAR | Status: AC
  Administered 2016-08-09: 30 mg via INTRAMUSCULAR

## 2016-08-09 MED ORDER — ONDANSETRON HCL 4 MG/2ML IJ SOLN: 4.0000 mg | Freq: Once | INTRAMUSCULAR | Status: DC

## 2016-08-09 MED ORDER — ONDANSETRON HCL 4 MG PO TABS: 4.0000 mg | ORAL_TABLET | Freq: Four times a day (QID) | ORAL | 0 refills | Status: DC

## 2016-08-09 MED ORDER — ONDANSETRON 4 MG PO TBDP
4.0000 mg | ORAL_TABLET | Freq: Once | ORAL | Status: AC
  Administered 2016-08-09: 4 mg via ORAL

## 2016-08-09 NOTE — ED Provider Notes (Signed)
[facility]    CSN: [id number] Arrival date & time: 08/09/16  1953     History   Chief Complaint Chief Complaint  Patient presents with  . Fever  . Shortness of Breath  . Headache    HPI Tanya [name] is a 40 y.o. female.   The history is provided by the patient and the spouse. The history is limited by a language barrier. A language interpreter was used (husband).  Fever  Temp source:  Oral Severity:  Mild Onset quality:  Sudden Duration:  1 day Progression:  Unchanged Chronicity:  New Relieved by:  None tried Worsened by:  Nothing Ineffective treatments:  None tried Associated symptoms: chest pain, headaches and vomiting   Associated symptoms: no cough, no diarrhea, no dysuria and no sore throat   Shortness of Breath  Associated symptoms: chest pain, fever, headaches and vomiting   Associated symptoms: no cough and no sore throat   Headache  Associated symptoms: fever and vomiting   Associated symptoms: no cough, no diarrhea, no drainage and no sore throat     Past Medical History:  Diagnosis Date  . Diabetes mellitus without complication (HCC)   . Hypertension     Patient Active Problem List   Diagnosis Date Noted  . Newly diagnosed diabetes (HCC) 10/20/2011  . DKA (diabetic ketoacidoses) (HCC) 10/20/2011    Past Surgical History:  Procedure Laterality Date  . ABDOMINAL SURGERY      OB History    Gravida Para Term Preterm AB Living   3 3 3     3    SAB TAB Ectopic Multiple Live Births           3       Home Medications    Prior to Admission medications   Medication Sig Start Date End Date Taking? Authorizing Provider  albuterol (PROVENTIL HFA;VENTOLIN HFA) 108 (90 BASE) MCG/ACT inhaler Inhale 2 puffs into the lungs every 6 (six) hours as needed for wheezing or shortness of breath. 07/06/14   Rodolph BongEvan S Corey, MD  cetirizine-pseudoephedrine (ZYRTEC-D) 5-120 MG per tablet Take 1 tablet by mouth daily. Patient taking differently: Take 1  tablet by mouth daily as needed for allergies or rhinitis.  09/17/14   Servando Salinaatherine H Rossi, NP  HYDROcodone-acetaminophen (NORCO/VICODIN) 5-325 MG tablet Take 1-2 tablets by mouth every 4 (four) hours as needed. 01/23/16   Elpidio AnisShari Upstill, PA-C  insulin glargine (LANTUS) 100 UNIT/ML injection Inject 15 Units into the skin at bedtime. 10/21/11 01/23/16  Belkys A Regalado, MD  ipratropium (ATROVENT) 0.06 % nasal spray Place 2 sprays into both nostrils 4 (four) times daily. 07/06/14   Rodolph BongEvan S Corey, MD  predniSONE (DELTASONE) 10 MG tablet Take 3 tablets (30 mg total) by mouth daily. 07/06/14   Rodolph BongEvan S Corey, MD    Family History History reviewed. No pertinent family history.  Social History Social History  Substance Use Topics  . Smoking status: Never Smoker  . Smokeless tobacco: Never Used  . Alcohol use No     Allergies   Quinine derivatives   Review of Systems Review of Systems  Constitutional: Positive for activity change, appetite change and fever.  HENT: Negative for postnasal drip and sore throat.   Respiratory: Positive for shortness of breath. Negative for cough.   Cardiovascular: Positive for chest pain. Negative for leg swelling.  Gastrointestinal: Positive for vomiting. Negative for constipation and diarrhea.  Genitourinary: Negative for dysuria.  Neurological: Positive for headaches.     Physical  Exam Triage Vital Signs ED Triage Vitals  Enc Vitals Group     BP 08/09/16 2011 136/86     Pulse Rate 08/09/16 2011 115     Resp 08/09/16 2011 25     Temp 08/09/16 2011 99.7 F (37.6 C)     Temp src --      SpO2 08/09/16 2011 100 %     Weight --      Height --      Head Circumference --      Peak Flow --      Pain Score 08/09/16 2012 8     Pain Loc --      Pain Edu? --      Excl. in GC? --    No data found.   Updated Vital Signs BP 136/86   Pulse 115   Temp 99.7 F (37.6 C)   Resp 25   SpO2 100%   Visual Acuity Right Eye Distance:   Left Eye Distance:     Bilateral Distance:    Right Eye Near:   Left Eye Near:    Bilateral Near:     Physical Exam  Constitutional: She appears well-developed and well-nourished. She appears distressed.  HENT:  Right Ear: External ear normal.  Left Ear: External ear normal.  Mouth/Throat: Oropharynx is clear and moist.  Eyes: Conjunctivae are normal. Pupils are equal, round, and reactive to light.  Neck: Normal range of motion. Neck supple.  Cardiovascular: Normal rate, regular rhythm, normal heart sounds and intact distal pulses.   Pulmonary/Chest: Effort normal.  Abdominal: Soft. Bowel sounds are increased. There is tenderness in the epigastric area.  Lymphadenopathy:    She has no cervical adenopathy.  Nursing note and vitals reviewed.    UC Treatments / Results  Labs (all labs ordered are listed, but only abnormal results are displayed) Labs Reviewed - No data to display  EKG  EKG Interpretation None       Radiology No results found.  Procedures Procedures (including critical care time)  Medications Ordered in UC Medications - No data to display   Initial Impression / Assessment and Plan / UC Course  I have reviewed the triage vital signs and the nursing notes.  Pertinent labs & imaging results that were available during my care of the patient were reviewed by me and considered in my medical decision making (see chart for details).  Clinical Course       Final Clinical Impressions(s) / UC Diagnoses   Final diagnoses:  None    New Prescriptions New Prescriptions   No medications on file     Linna HoffJames D Kano Heckmann, MD 08/09/16 2056

## 2016-08-09 NOTE — Discharge Instructions (Signed)
Clear liquid , bland diet tonight as tolerated, advance on sat as improved, use medicine as needed, return or see your doctor if any problems. °

## 2016-08-09 NOTE — ED Triage Notes (Signed)
Pt here for headache, fever, pain with breathing. sts she has been vomiting.

## 2016-08-13 ENCOUNTER — Encounter (HOSPITAL_COMMUNITY): Payer: Self-pay | Admitting: Emergency Medicine

## 2016-08-13 ENCOUNTER — Emergency Department (HOSPITAL_COMMUNITY)
Admission: EM | Admit: 2016-08-13 | Discharge: 2016-08-13 | Disposition: A | Discharge status: Home / Self Care | Payer: BLUE CROSS/BLUE SHIELD | Attending: Emergency Medicine | Admitting: Emergency Medicine

## 2016-08-13 ENCOUNTER — Emergency Department (HOSPITAL_COMMUNITY): Payer: BLUE CROSS/BLUE SHIELD

## 2016-08-13 DIAGNOSIS — Z794 Long term (current) use of insulin: Secondary | ICD-10-CM | POA: Insufficient documentation

## 2016-08-13 DIAGNOSIS — N390 Urinary tract infection, site not specified: Secondary | ICD-10-CM | POA: Diagnosis not present

## 2016-08-13 DIAGNOSIS — R509 Fever, unspecified: Secondary | ICD-10-CM | POA: Diagnosis present

## 2016-08-13 DIAGNOSIS — E119 Type 2 diabetes mellitus without complications: Secondary | ICD-10-CM | POA: Insufficient documentation

## 2016-08-13 DIAGNOSIS — I10 Essential (primary) hypertension: Secondary | ICD-10-CM | POA: Insufficient documentation

## 2016-08-13 LAB — COMPREHENSIVE METABOLIC PANEL
ALBUMIN: 3 g/dL — AB (ref 3.5–5.0)
ALK PHOS: 134 U/L — AB (ref 38–126)
ALT: 31 U/L (ref 14–54)
ANION GAP: 8 (ref 5–15)
AST: 30 U/L (ref 15–41)
BUN: 11 mg/dL (ref 6–20)
CALCIUM: 8.5 mg/dL — AB (ref 8.9–10.3)
CHLORIDE: 104 mmol/L (ref 101–111)
CO2: 24 mmol/L (ref 22–32)
Creatinine, Ser: 1 mg/dL (ref 0.44–1.00)
GFR calc non Af Amer: >60 mL/min (ref >60)
GLUCOSE: 190 mg/dL — AB (ref 65–99)
POTASSIUM: 3.2 mmol/L — AB (ref 3.5–5.1)
SODIUM: 136 mmol/L (ref 135–145)
Total Bilirubin: 1.2 mg/dL (ref 0.3–1.2)
Total Protein: 6.8 g/dL (ref 6.5–8.1)

## 2016-08-13 LAB — CBC WITH DIFFERENTIAL/PLATELET
BASOS PCT: 1 %
Basophils Absolute: 0 10*3/uL (ref 0.0–0.1)
EOS ABS: 0 10*3/uL (ref 0.0–0.7)
EOS PCT: 0 %
HCT: 31.7 % — ABNORMAL LOW (ref 36.0–46.0)
HEMOGLOBIN: 10.5 g/dL — AB (ref 12.0–15.0)
Lymphocytes Relative: 25 %
Lymphs Abs: 1.9 10*3/uL (ref 0.7–4.0)
MCH: 26.3 pg (ref 26.0–34.0)
MCHC: 33.1 g/dL (ref 30.0–36.0)
MCV: 79.4 fL (ref 78.0–100.0)
MONOS PCT: 12 %
Monocytes Absolute: 0.9 10*3/uL (ref 0.1–1.0)
NEUTROS PCT: 62 %
Neutro Abs: 4.9 10*3/uL (ref 1.7–7.7)
PLATELETS: 274 10*3/uL (ref 150–400)
RBC: 3.99 MIL/uL (ref 3.87–5.11)
RDW: 14.3 % (ref 11.5–15.5)
WBC: 7.8 10*3/uL (ref 4.0–10.5)

## 2016-08-13 LAB — URINALYSIS, ROUTINE W REFLEX MICROSCOPIC
BILIRUBIN URINE: NEGATIVE
GLUCOSE, UA: 50 mg/dL — AB
KETONES UR: NEGATIVE mg/dL
NITRITE: POSITIVE — AB
PROTEIN: 30 mg/dL — AB
Specific Gravity, Urine: 1.012 (ref 1.005–1.030)
pH: 5 (ref 5.0–8.0)

## 2016-08-13 LAB — LIPASE, BLOOD: Lipase: 17 U/L (ref 11–51)

## 2016-08-13 LAB — I-STAT BETA HCG BLOOD, ED (MC, WL, AP ONLY): I-stat hCG, quantitative: <5 m[IU]/mL (ref <5)

## 2016-08-13 LAB — I-STAT CG4 LACTIC ACID, ED: Lactic Acid, Venous: 0.99 mmol/L (ref 0.5–1.9)

## 2016-08-13 MED ORDER — SODIUM CHLORIDE 0.9 % IV BOLUS (SEPSIS)
1000.0000 mL | Freq: Once | INTRAVENOUS | Status: AC
  Administered 2016-08-13: 1000 mL via INTRAVENOUS

## 2016-08-13 MED ORDER — CEPHALEXIN 500 MG PO CAPS: 1000.0000 mg | ORAL_CAPSULE | Freq: Two times a day (BID) | ORAL | 0 refills | Status: DC

## 2016-08-13 MED ORDER — ONDANSETRON HCL 4 MG/2ML IJ SOLN
4.0000 mg | Freq: Once | INTRAMUSCULAR | Status: AC
  Administered 2016-08-13: 4 mg via INTRAVENOUS
  Filled 2016-08-13: qty 2

## 2016-08-13 MED ORDER — DEXTROSE 5 % IV SOLN
1.0000 g | Freq: Once | INTRAVENOUS | Status: AC
  Administered 2016-08-13: 1 g via INTRAVENOUS
  Filled 2016-08-13: qty 10

## 2016-08-13 MED ORDER — ACETAMINOPHEN 325 MG PO TABS
650.0000 mg | ORAL_TABLET | Freq: Once | ORAL | Status: AC | PRN
  Administered 2016-08-13: 650 mg via ORAL
  Filled 2016-08-13: qty 2

## 2016-08-13 NOTE — ED Notes (Signed)
MD at bedside. 

## 2016-08-13 NOTE — ED Notes (Signed)
Pt tolerating oral fluids 

## 2016-08-13 NOTE — ED Triage Notes (Signed)
Pt was seen last week by urgent care for flu like symptoms and emesis and was given a toradol shot and zofran and sent home.  Pt comes today with flu like symptoms with fever of 102.3.  Pt moaning in triage.  Wrapped up in blanket and sweater and scarf.  Made pt aware of increasing core heat with extra clothing.

## 2016-08-13 NOTE — ED Provider Notes (Signed)
[facility]-EMERGENCY DEPT Provider Note   CSN: [id number] Arrival date & time: 08/13/16  0004   By signing my name below, I, Clarisse GougeXavier Herndon, attest that this documentation has been prepared under the direction and in the presence of Gilda Creasehristopher J Pollina, MD. Electronically signed, Clarisse GougeXavier Herndon, ED Scribe. 08/13/16. 5:04 AM.   History   Chief Complaint Chief Complaint  Patient presents with  . Flu like symptoms   The history is provided by the spouse, medical records and the patient. The history is limited by a language barrier. A language interpreter was used.    HPI Comments: Tanya [name] is a 40 y.o. female who presents to the Emergency Department complaining of persistent "deep" fever x 1 week. Pt does not speak English, and her spouse acts as an Equities traderinterpreter. Spouse report pt exhibits associated headache, N/V, chest pain with vomiting spells, generalized body aches, appetite change, fatigue and sore throat. He further states that the pt can not sleep because of her symptoms. Per spouse, pt denies cough. No sick contacts at home.  Past Medical History:  Diagnosis Date  . Diabetes mellitus without complication (HCC)   . Hypertension     Patient Active Problem List   Diagnosis Date Noted  . Newly diagnosed diabetes (HCC) 10/20/2011  . DKA (diabetic ketoacidoses) (HCC) 10/20/2011    Past Surgical History:  Procedure Laterality Date  . ABDOMINAL SURGERY      OB History    Gravida Para Term Preterm AB Living   3 3 3     3    SAB TAB Ectopic Multiple Live Births           3       Home Medications    Prior to Admission medications   Medication Sig Start Date End Date Taking? Authorizing Provider  albuterol (PROVENTIL HFA;VENTOLIN HFA) 108 (90 BASE) MCG/ACT inhaler Inhale 2 puffs into the lungs every 6 (six) hours as needed for wheezing or shortness of breath. 07/06/14   Rodolph BongEvan S Corey, MD  cephALEXin (KEFLEX) 500 MG capsule Take 2 capsules (1,000 mg total) by mouth 2 (two)  times daily. 08/13/16   Gilda Creasehristopher J Pollina, MD  cetirizine-pseudoephedrine (ZYRTEC-D) 5-120 MG per tablet Take 1 tablet by mouth daily. Patient taking differently: Take 1 tablet by mouth daily as needed for allergies or rhinitis.  09/17/14   Servando Salinaatherine H Rossi, NP  HYDROcodone-acetaminophen (NORCO/VICODIN) 5-325 MG tablet Take 1-2 tablets by mouth every 4 (four) hours as needed. 01/23/16   Elpidio AnisShari Upstill, PA-C  insulin glargine (LANTUS) 100 UNIT/ML injection Inject 15 Units into the skin at bedtime. 10/21/11 01/23/16  Belkys A Regalado, MD  ipratropium (ATROVENT) 0.06 % nasal spray Place 2 sprays into both nostrils 4 (four) times daily. 07/06/14   Rodolph BongEvan S Corey, MD  ondansetron (ZOFRAN) 4 MG tablet Take 1 tablet (4 mg total) by mouth every 6 (six) hours. Prn n/v. 08/09/16   Linna HoffJames D Kindl, MD  predniSONE (DELTASONE) 10 MG tablet Take 3 tablets (30 mg total) by mouth daily. 07/06/14   Rodolph BongEvan S Corey, MD    Family History No family history on file.  Social History Social History  Substance Use Topics  . Smoking status: Never Smoker  . Smokeless tobacco: Never Used  . Alcohol use No     Allergies   Quinine derivatives   Review of Systems Review of Systems  All other systems reviewed and are negative.  A complete 10 system review of systems was obtained and all systems are  negative except as noted in the HPI and PMH.   Physical Exam Updated Vital Signs BP 118/79   Pulse 86   Temp 102.3 F (39.1 C) (Oral)   Resp 20   Ht 5\' 4"  (1.626 m)   Wt 190 lb (86.2 kg)   LMP 07/30/2016   SpO2 99%   BMI 32.61 kg/m   Physical Exam  Constitutional: She is oriented to person, place, and time. She appears well-developed and well-nourished. She appears distressed.   Persistent painful groaning.  HENT:  Head: Normocephalic and atraumatic.  Right Ear: Hearing normal.  Left Ear: Hearing normal.  Nose: Nose normal.  Mouth/Throat: Oropharynx is clear and moist and mucous membranes are normal.  Eyes:  Conjunctivae and EOM are normal. Pupils are equal, round, and reactive to light.  Neck: Normal range of motion. Neck supple.  Cardiovascular: Regular rhythm, S1 normal and S2 normal.  Exam reveals no gallop and no friction rub.   No murmur heard. Pulmonary/Chest: Effort normal and breath sounds normal. No respiratory distress. She exhibits no tenderness.  Abdominal: Soft. Normal appearance and bowel sounds are normal. There is no hepatosplenomegaly. There is no tenderness. There is no rebound, no guarding, no tenderness at McBurney's point and negative Murphy's sign. No hernia.  Musculoskeletal: Normal range of motion.  Neurological: She is alert and oriented to person, place, and time. She has normal strength. No cranial nerve deficit or sensory deficit. Coordination normal. GCS eye subscore is 4. GCS verbal subscore is 5. GCS motor subscore is 6.  Skin: Skin is warm, dry and intact. No rash noted. No cyanosis.  Psychiatric: She has a normal mood and affect. Her speech is normal and behavior is normal. Thought content normal.  Nursing note and vitals reviewed.    ED Treatments / Results  DIAGNOSTIC STUDIES: Oxygen Saturation is 98% on RA, normal by my interpretation.    COORDINATION OF CARE: 5:04 AM Discussed treatment plan with pt at bedside and pt agreed to plan.  Labs (all labs ordered are listed, but only abnormal results are displayed) Labs Reviewed  CBC WITH DIFFERENTIAL/PLATELET - Abnormal; Notable for the following:       Result Value   Hemoglobin 10.5 (*)    HCT 31.7 (*)    All other components within normal limits  COMPREHENSIVE METABOLIC PANEL - Abnormal; Notable for the following:    Potassium 3.2 (*)    Glucose, Bld 190 (*)    Calcium 8.5 (*)    Albumin 3.0 (*)    Alkaline Phosphatase 134 (*)    All other components within normal limits  URINALYSIS, ROUTINE W REFLEX MICROSCOPIC - Abnormal; Notable for the following:    APPearance HAZY (*)    Glucose, UA 50 (*)     Hgb urine dipstick MODERATE (*)    Protein, ur 30 (*)    Nitrite POSITIVE (*)    Leukocytes, UA LARGE (*)    Bacteria, UA MANY (*)    Squamous Epithelial / LPF 0-5 (*)    All other components within normal limits  URINE CULTURE  LIPASE, BLOOD  I-STAT CG4 LACTIC ACID, ED  I-STAT BETA HCG BLOOD, ED ([facility], [facility], [facility] ONLY)    EKG  EKG Interpretation None       Radiology Dg Chest 2 View  Result Date: 08/13/2016 CLINICAL DATA:  Cough and fever for 1 week. EXAM: CHEST  2 VIEW COMPARISON:  Radiographs 4 days prior 08/09/2016 FINDINGS: Lower lung volumes from prior exam. The cardiomediastinal  contours are normal. The lungs are clear. Pulmonary vasculature is normal. No consolidation, pleural effusion, or pneumothorax. No acute osseous abnormalities are seen. IMPRESSION: Lower lung volumes without acute abnormality. Electronically Signed   By: Rubye Oaks M.D.   On: 08/13/2016 01:32    Procedures Procedures (including critical care time)  Medications Ordered in ED Medications  cefTRIAXone (ROCEPHIN) 1 g in dextrose 5 % 50 mL IVPB (not administered)  acetaminophen (TYLENOL) tablet 650 mg (650 mg Oral Given 08/13/16 0027)  sodium chloride 0.9 % bolus 1,000 mL (0 mLs Intravenous Stopped 08/13/16 0310)  ondansetron (ZOFRAN) injection 4 mg (4 mg Intravenous Given 08/13/16 0124)  sodium chloride 0.9 % bolus 1,000 mL (0 mLs Intravenous Stopped 08/13/16 0427)     Initial Impression / Assessment and Plan / ED Course  I have reviewed the triage vital signs and the nursing notes.  Pertinent labs & imaging results that were available during my care of the patient were reviewed by me and considered in my medical decision making (see chart for details).  Clinical Course    Patient presents with fever. Patient has had nausea and vomiting, possibly diarrhea. She has had nasal congestion but no cough. Patient hemodynamically stable at arrival. She did have mild tachycardia secondary to fever  which resolved with IV fluids and antiemetics. Patient not tolerating oral intake without difficulty. Blood work normal. No signs of sepsis. She does, however, have evidence of urinary tract infection. Patient to minister to IV Rocephin, will be continued on outpatient Keflex.  Discussed return precautions.  Pt is hemodynamically stable & in NAD prior to discharge.   Final Clinical Impressions(s) / ED Diagnoses   Final diagnoses:  Urinary tract infection without hematuria, site unspecified    New Prescriptions New Prescriptions   CEPHALEXIN (KEFLEX) 500 MG CAPSULE    Take 2 capsules (1,000 mg total) by mouth 2 (two) times daily.  I personally performed the services described in this documentation, which was scribed in my presence. The recorded information has been reviewed and is accurate.    Gilda Crease, MD 08/13/16 [phone]

## 2016-08-13 NOTE — ED Notes (Signed)
Pt in X-Ray ?

## 2016-08-15 LAB — URINE CULTURE: Culture: >=100000 — AB

## 2016-08-16 ENCOUNTER — Telehealth (HOSPITAL_BASED_OUTPATIENT_CLINIC_OR_DEPARTMENT_OTHER): Payer: Self-pay

## 2016-08-16 NOTE — Telephone Encounter (Signed)
Post ED Visit - Positive Culture Follow-up  Culture report reviewed by antimicrobial stewardship pharmacist:  []  Enzo BiNathan Batchelder, Pharm.D. []  Celedonio MiyamotoJeremy Frens, Pharm.D., BCPS []  Garvin FilaMike Maccia, Pharm.D. []  Georgina PillionElizabeth Martin, Pharm.D., BCPS []  CrestlineMinh Pham, [address]Ph[address]D., BCPS, AAHIVP []  Estella HuskMichelle Turner, Pharm.D., BCPS, AAHIVP []  Cassie Stewart, [address]Ph[address]D. []  Rob Oswaldo DoneVincent, [address]Milas Gain. X  James Johnston, Pharm.D.  Positive urine culture, >/= 100,000 colonies -> E Coli Treated with cephalexin, organism sensitive to the same and no further patient follow-up is required at this time.  Arvid RightClark, Rhegan Trunnell Dorn 08/16/2016, 5:00 PM

## 2016-11-12 ENCOUNTER — Ambulatory Visit (INDEPENDENT_AMBULATORY_CARE_PROVIDER_SITE_OTHER): Payer: BLUE CROSS/BLUE SHIELD | Admitting: Neurology

## 2016-11-12 ENCOUNTER — Encounter: Payer: Self-pay | Admitting: Neurology

## 2016-11-12 VITALS — BP 110/80 | HR 86 | Ht 64.0 in | Wt 189.0 lb

## 2016-11-12 DIAGNOSIS — IMO0002 Reserved for concepts with insufficient information to code with codable children: Secondary | ICD-10-CM

## 2016-11-12 DIAGNOSIS — G43709 Chronic migraine without aura, not intractable, without status migrainosus: Secondary | ICD-10-CM

## 2016-11-12 MED ORDER — SUMATRIPTAN SUCCINATE 100 MG PO TABS: ORAL_TABLET | ORAL | 2 refills | Status: AC

## 2016-11-12 MED ORDER — SUMATRIPTAN SUCCINATE 100 MG PO TABS: ORAL_TABLET | ORAL | 2 refills | Status: DC

## 2016-11-12 MED ORDER — NORTRIPTYLINE HCL 25 MG PO CAPS: 25.0000 mg | ORAL_CAPSULE | Freq: Every day | ORAL | 3 refills | Status: DC

## 2016-11-12 NOTE — Patient Instructions (Addendum)
Migraine Recommendations: 1.  Start nortriptyline 25mg  at bedtime.  Call in 4 weeks with update and we can adjust dose if needed. 2.  Take sumatriptan 100mg  at earliest onset of headache.  May repeat dose once in 2 hours if needed.  Do not exceed two tablets in 24 hours. 3.  Stop Tylenol, Motrin, ibuprofen.  Limit use of pain relievers to no more than 2 days out of the week.  These medications include acetaminophen, ibuprofen, triptans and narcotics.  This will help reduce risk of rebound headaches. 4.  Be aware of common food triggers such as processed sweets, processed foods with nitrites (such as deli meat, hot dogs, sausages), foods with MSG, alcohol (such as wine), chocolate, certain cheeses, certain fruits (dried fruits, some citrus fruit), vinegar, diet soda. 4.  Avoid caffeine 5.  Routine exercise 6.  Proper sleep hygiene 7.  Stay adequately hydrated with water 8.  Keep a headache diary. 9.  Maintain proper stress management. 10.  Do not skip meals. 11.  Consider supplements:  Magnesium citrate 400mg  to 600mg  daily, riboflavin 400mg , Coenzyme Q 10 100mg  three times daily 12.  Contact me in 4 weeks with update.  Follow up in 3 months.

## 2016-11-12 NOTE — Progress Notes (Signed)
NEUROLOGY CONSULTATION NOTE  Tanya [name] MRN: [id number] DOB: [birth year]  Referring provider: Boneta Lucks, NP Primary care provider: Boneta Lucks, NP  Reason for consult:  headache  HISTORY OF PRESENT ILLNESS: Tanya [name] is a 41 year old French-speaking right woman from [place] who presents for headache.  She is accompanied by her husband who helps interprets.  ED and PCP notes reviewed.  She is accompanied by her husband who supplements history.  A French-speaking translator is also present.  Onset:  98-43 years old Location:  Left frontal, radiating down the left side of her neck Quality:  stabbing Intensity:  8/10 Aura:  Blurred vision/lights/colors Prodrome:  no Postdrome:  no Associated symptoms:  Photophobia, phonophobia, sometimes nausea and vomiting.  She has not had any new worse headache of her life, waking up from sleep Duration:  All day Frequency:  20 to 30 days per month (they have been almost daily for many years) Frequency of abortive medication: daily Triggers/exacerbating factors:  no Relieving factors:  no Activity:  aggravates Past NSAIDS:  no Past analgesics:  no Past abortive triptans/ergotamine:  Dihydroergotamine Past muscle relaxants:  no Past anti-emetic:  no Past antihypertensive medications:  no Past antidepressant medications:  no Past anticonvulsant medications:  no Past vitamins/Herbal/Supplements:  no Past antihistamines/decongestants:  no Other past therapies:  no  Current NSAIDS:  Motrin Current analgesics:  Tylenol Current triptans:  no Current anti-emetic:  Zofran Current muscle relaxants:  no Current anti-anxiolytic:  no Current sleep aide:  no Current Antihypertensive medications:  no Current Antidepressant medications:  no Current Anticonvulsant medications:  no Current Vitamins/Herbal/Supplements:  no Current Antihistamines/Decongestants:  no Other therapy:  no  Caffeine:  tea Alcohol:  no Smoker:  no Diet:   Drinks water Exercise:  no Depression/anxiety:  A little Sleep hygiene:  good Family history of headache:  sisters  She presented to the ED twice in December 2017 for headache.  When she presented to the ED on 08/13/16, she presented with fever of 102.3 F with nasal congestion, nausea, vomiting and possibly diarrhea.  UA was positive for UTI (positive leukocytes, nitrite and bacteria) and she was treated with antibiotics.  Normally, she does not have fever with her headaches. 08/13/16 Labs:  CBC with WBC 7.8, HGB 10.5, HCT 31.7 and PLT 274; CMP with Na 136, K 3.2, Cl 104, CO2 24, glucose 190, BUN 11, Cr 1, total bili 1.2, ALP 134, AST 30 and ALT 31.  PAST MEDICAL HISTORY: Past Medical History:  Diagnosis Date  . Diabetes mellitus without complication (HCC)   . Hypertension     PAST SURGICAL HISTORY: Past Surgical History:  Procedure Laterality Date  . ABDOMINAL SURGERY      MEDICATIONS: Current Outpatient Prescriptions on File Prior to Visit  Medication Sig Dispense Refill  . albuterol (PROVENTIL HFA;VENTOLIN HFA) 108 (90 BASE) MCG/ACT inhaler Inhale 2 puffs into the lungs every 6 (six) hours as needed for wheezing or shortness of breath. 1 Inhaler 2  . cephALEXin (KEFLEX) 500 MG capsule Take 2 capsules (1,000 mg total) by mouth 2 (two) times daily. 40 capsule 0  . cetirizine-pseudoephedrine (ZYRTEC-D) 5-120 MG per tablet Take 1 tablet by mouth daily. (Patient taking differently: Take 1 tablet by mouth daily as needed for allergies or rhinitis. ) 15 tablet 0  . HYDROcodone-acetaminophen (NORCO/VICODIN) 5-325 MG tablet Take 1-2 tablets by mouth every 4 (four) hours as needed. 12 tablet 0  . insulin glargine (LANTUS) 100 UNIT/ML injection Inject 15 Units into  the skin at bedtime. 10 mL 3  . ipratropium (ATROVENT) 0.06 % nasal spray Place 2 sprays into both nostrils 4 (four) times daily. 15 mL 1  . ondansetron (ZOFRAN) 4 MG tablet Take 1 tablet (4 mg total) by mouth every 6 (six) hours.  Prn n/v. 8 tablet 0  . predniSONE (DELTASONE) 10 MG tablet Take 3 tablets (30 mg total) by mouth daily. 15 tablet 0   No current facility-administered medications on file prior to visit.     ALLERGIES: Allergies  Allergen Reactions  . Quinine Derivatives Hives    FAMILY HISTORY: No family history on file.  SOCIAL HISTORY: Social History   Social History  . Marital status: Married    Spouse name: N/A  . Number of children: 3  . Years of education: 7th    Occupational History  . not working    Social History Main Topics  . Smoking status: Never Smoker  . Smokeless tobacco: Never Used  . Alcohol use No  . Drug use: No  . Sexual activity: Yes    Birth control/ protection: None   Other Topics Concern  . Not on file   Social History Narrative   Lives with husband and 3 children.  Not working.  Education: 7th grade.     REVIEW OF SYSTEMS: Constitutional: No fevers, chills, or sweats, no generalized fatigue, change in appetite Eyes: No visual changes, double vision, eye pain Ear, nose and throat: No hearing loss, ear pain, nasal congestion, sore throat Cardiovascular: No chest pain, palpitations Respiratory:  No shortness of breath at rest or with exertion, wheezes GastrointestinaI: No nausea, vomiting, diarrhea, abdominal pain, fecal incontinence Genitourinary:  No dysuria, urinary retention or frequency Musculoskeletal:  No neck pain, back pain Integumentary: No rash, pruritus, skin lesions Neurological: as above Psychiatric: No depression, insomnia, anxiety Endocrine: No palpitations, fatigue, diaphoresis, mood swings, change in appetite, change in weight, increased thirst Hematologic/Lymphatic:  No purpura, petechiae. Allergic/Immunologic: no itchy/runny eyes, nasal congestion, recent allergic reactions, rashes  PHYSICAL EXAM: Vitals:   11/12/16 1021  BP: 110/80  Pulse: 86   General: No acute distress.  Patient appears well-groomed.  Head:   Normocephalic/atraumatic Eyes:  fundi examined but not visualized Neck: supple, no paraspinal tenderness, full range of motion Back: No paraspinal tenderness Heart: regular rate and rhythm Lungs: Clear to auscultation bilaterally. Vascular: No carotid bruits. Neurological Exam: Mental status: alert and oriented to person, place, and time, recent and remote memory intact, fund of knowledge intact, attention and concentration intact, speech fluent and not dysarthric, language intact. Cranial nerves: CN I: not tested CN II: pupils equal, round and reactive to light, visual fields intact CN III, IV, VI:  full range of motion, no nystagmus, no ptosis CN V: facial sensation intact CN VII: upper and lower face symmetric CN VIII: hearing intact CN IX, X: gag intact, uvula midline CN XI: sternocleidomastoid and trapezius muscles intact CN XII: tongue midline Bulk & Tone: normal, no fasciculations. Motor:  5/5 throughout  Sensation: temperature and vibration sensation intact. Deep Tendon Reflexes:  2+ throughout, toes downgoing.  Finger to nose testing:  Without dysmetria.  Heel to shin:  Without dysmetria.  Gait:  Normal station and stride.  Able to turn and tandem walk. Romberg negative.  IMPRESSION: Chronic migraine complicated by medication overuse  PLAN: 1.  Start nortriptyline 25mg  at bedtime.  Call in 4 weeks with update and we can adjust dose if needed. 2.  Take sumatriptan 100mg  at earliest onset of  headache.  May repeat dose once in 2 hours if needed.  Do not exceed two tablets in 24 hours. 3.  Stop Tylenol, Motrin, ibuprofen.  Limit use of pain relievers to no more than 2 days out of the week.  These medications include acetaminophen, ibuprofen, triptans and narcotics.  This will help reduce risk of rebound headaches. 4.  Be aware of common food triggers such as processed sweets, processed foods with nitrites (such as deli meat, hot dogs, sausages), foods with MSG, alcohol (such as  wine), chocolate, certain cheeses, certain fruits (dried fruits, some citrus fruit), vinegar, diet soda. 4.  Avoid caffeine 5.  Routine exercise 6.  Proper sleep hygiene 7.  Stay adequately hydrated with water 8.  Keep a headache diary. 9.  Maintain proper stress management. 10.  Do not skip meals. 11.  Consider supplements:  Magnesium citrate 400mg  to 600mg  daily, riboflavin 400mg , Coenzyme Q 10 100mg  three times daily 12.  Contact me in 4 weeks with update.  Follow up in 3 months.  Thank you for allowing me to take part in the care of this patient.  Shon Millet, DO  CC: Boneta Lucks, NP

## 2017-02-12 ENCOUNTER — Ambulatory Visit: Payer: BLUE CROSS/BLUE SHIELD | Admitting: Neurology

## 2017-02-12 DIAGNOSIS — Z029 Encounter for administrative examinations, unspecified: Secondary | ICD-10-CM

## 2017-02-13 ENCOUNTER — Encounter: Payer: Self-pay | Admitting: Neurology

## 2017-11-16 ENCOUNTER — Encounter (HOSPITAL_COMMUNITY): Payer: Self-pay

## 2017-11-16 ENCOUNTER — Other Ambulatory Visit: Payer: Self-pay

## 2017-11-16 ENCOUNTER — Emergency Department (HOSPITAL_COMMUNITY)
Admission: EM | Admit: 2017-11-16 | Discharge: 2017-11-16 | Disposition: A | Discharge status: Home / Self Care | Payer: BLUE CROSS/BLUE SHIELD | Attending: Emergency Medicine | Admitting: Emergency Medicine

## 2017-11-16 DIAGNOSIS — I10 Essential (primary) hypertension: Secondary | ICD-10-CM | POA: Insufficient documentation

## 2017-11-16 DIAGNOSIS — E119 Type 2 diabetes mellitus without complications: Secondary | ICD-10-CM | POA: Insufficient documentation

## 2017-11-16 DIAGNOSIS — N898 Other specified noninflammatory disorders of vagina: Secondary | ICD-10-CM | POA: Insufficient documentation

## 2017-11-16 DIAGNOSIS — J358 Other chronic diseases of tonsils and adenoids: Secondary | ICD-10-CM | POA: Insufficient documentation

## 2017-11-16 DIAGNOSIS — Z79899 Other long term (current) drug therapy: Secondary | ICD-10-CM | POA: Insufficient documentation

## 2017-11-16 DIAGNOSIS — N941 Unspecified dyspareunia: Secondary | ICD-10-CM | POA: Insufficient documentation

## 2017-11-16 DIAGNOSIS — Z794 Long term (current) use of insulin: Secondary | ICD-10-CM | POA: Insufficient documentation

## 2017-11-16 LAB — RAPID STREP SCREEN (MED CTR MEBANE ONLY): Streptococcus, Group A Screen (Direct): NEGATIVE

## 2017-11-16 MED ORDER — PENICILLIN V POTASSIUM 500 MG PO TABS: 500.0000 mg | ORAL_TABLET | Freq: Four times a day (QID) | ORAL | 0 refills | Status: AC

## 2017-11-16 MED ORDER — PHENOL 1.4 % MT LIQD: 1.0000 | OROMUCOSAL | 0 refills | Status: DC | PRN

## 2017-11-16 NOTE — ED Triage Notes (Signed)
Patient complains of cold symptoms a week ago and now feels as if something stuck in throat, no pain just the irritation

## 2017-11-16 NOTE — ED Provider Notes (Signed)
[facility] Provider Note   CSN: [id number] Arrival date & time: 11/16/17  1010     History   Chief Complaint No chief complaint on file.   HPI Tanya [name] is a 42 y.o. female presenting for evaluation of throat irritation.  Level 5 caveat due to language barrier.  Patient speaks [place] and interpreter service was unable to be used.  Husband helped interpret.  Patient states she has had a sore throat for the past 3 weeks.  Initially she had associated symptoms including nasal congestion and cough.  These have resolved, but she has discontinued left-sided throat irritation.  She feels like there is something stuck on her tonsils that she cannot get out.  It does not improve with drinking or coughing.  It does not feel like it is blocking her airway.  She denies current fevers, chills, ear pain, nasal congestion, sore pain, cough.  No difficulty breathing.  She denies other medical problems, does not take medications daily.  Additionally, patient reports that she has intermittent pain during intercourse and intermittent vaginal discharge.  She denies bleeding.  She does not want a pelvic exam or evaluation for this condition today.  He would like information for follow-up with the health center.  HPI  Past Medical History:  Diagnosis Date  . Diabetes mellitus without complication (HCC)   . Hypertension     Patient Active Problem List   Diagnosis Date Noted  . Newly diagnosed diabetes (HCC) 10/20/2011  . DKA (diabetic ketoacidoses) (HCC) 10/20/2011    Past Surgical History:  Procedure Laterality Date  . ABDOMINAL SURGERY       OB History    Gravida  3   Para  3   Term  3   Preterm      AB      Living  3     SAB      TAB      Ectopic      Multiple      Live Births  3            Home Medications    Prior to Admission medications   Medication Sig Start Date End Date Taking? Authorizing Provider  albuterol  (PROVENTIL HFA;VENTOLIN HFA) 108 (90 BASE) MCG/ACT inhaler Inhale 2 puffs into the lungs every 6 (six) hours as needed for wheezing or shortness of breath. 07/06/14   Rodolph Bongorey, Evan S, MD  cephALEXin (KEFLEX) 500 MG capsule Take 2 capsules (1,000 mg total) by mouth 2 (two) times daily. 08/13/16   Gilda CreasePollina, Christopher J, MD  cetirizine-pseudoephedrine (ZYRTEC-D) 5-120 MG per tablet Take 1 tablet by mouth daily. Patient taking differently: Take 1 tablet by mouth daily as needed for allergies or rhinitis.  09/17/14   Servando Salinaossi, Catherine H, NP  HYDROcodone-acetaminophen (NORCO/VICODIN) 5-325 MG tablet Take 1-2 tablets by mouth every 4 (four) hours as needed. 01/23/16   Elpidio AnisUpstill, Shari, PA-C  insulin glargine (LANTUS) 100 UNIT/ML injection Inject 15 Units into the skin at bedtime. 10/21/11 01/23/16  Regalado, Belkys A, MD  ipratropium (ATROVENT) 0.06 % nasal spray Place 2 sprays into both nostrils 4 (four) times daily. 07/06/14   Rodolph Bongorey, Evan S, MD  nortriptyline (PAMELOR) 25 MG capsule Take 1 capsule (25 mg total) by mouth at bedtime. 11/12/16   Everlena CooperJaffe, Adam R, DO  ondansetron (ZOFRAN) 4 MG tablet Take 1 tablet (4 mg total) by mouth every 6 (six) hours. Prn n/v. 08/09/16   Linna HoffKindl, James D, MD  penicillin  v potassium (VEETID) 500 MG tablet Take 1 tablet (500 mg total) by mouth 4 (four) times daily for 7 days. 11/16/17 11/23/17  Shelbe Haglund, PA-C  phenol (CHLORASEPTIC) 1.4 % LIQD Use as directed 1 spray in the mouth or throat as needed for throat irritation / pain. 11/16/17   Camauri Fleece, PA-C  predniSONE (DELTASONE) 10 MG tablet Take 3 tablets (30 mg total) by mouth daily. 07/06/14   Rodolph Bong, MD  SUMAtriptan (IMITREX) 100 MG tablet Take 1 tablet at earliest onset of headache.  May repeat in 2 hours if headache persists/recurs.  Do not exceed 2 tablets in 24 hours 11/12/16   Drema Dallas, DO    Family History No family history on file.  Social History Social History   Tobacco Use  . Smoking status: Never  Smoker  . Smokeless tobacco: Never Used  Substance Use Topics  . Alcohol use: No  . Drug use: No     Allergies   Quinine derivatives   Review of Systems Review of Systems  Constitutional: Negative for chills and fever.  HENT: Negative for congestion, sore throat and trouble swallowing.        L sided throat irritation  Respiratory: Negative for shortness of breath and stridor.      Physical Exam Updated Vital Signs BP 121/85 (BP Location: Right Arm)   Pulse 85   Temp (!) 97.5 F (36.4 C) (Oral)   Resp 20   SpO2 99%   Physical Exam  Constitutional: She is oriented to person, place, and time. She appears well-developed and well-nourished. No distress.  HENT:  Head: Normocephalic and atraumatic.  Right Ear: Tympanic membrane, external ear and ear canal normal.  Left Ear: Tympanic membrane, external ear and ear canal normal.  Nose: Nose normal. Right sinus exhibits no maxillary sinus tenderness and no frontal sinus tenderness. Left sinus exhibits no maxillary sinus tenderness and no frontal sinus tenderness.  Mouth/Throat: Uvula is midline, oropharynx is clear and moist and mucous membranes are normal.  L tonsil with multiple small tonsilloliths.  No erythema or swelling.  No airway compromise.  Uvula midline with equal palate rise.  No obvious tonsillar abscess.  Eyes: Pupils are equal, round, and reactive to light. Conjunctivae and EOM are normal.  Neck: Normal range of motion.  Cardiovascular: Normal rate, regular rhythm and intact distal pulses.  Pulmonary/Chest: Effort normal and breath sounds normal. She has no decreased breath sounds. She has no wheezes. She has no rhonchi. She has no rales.  Pt speaking in full sentences without difficulty.  Clear lung sounds in all fields.  Abdominal: Soft. She exhibits no distension. There is no tenderness.  Musculoskeletal: Normal range of motion.  Lymphadenopathy:    She has no cervical adenopathy.  Neurological: She is alert  and oriented to person, place, and time.  Skin: Skin is warm.  Psychiatric: She has a normal mood and affect.  Nursing note and vitals reviewed.    ED Treatments / Results  Labs (all labs ordered are listed, but only abnormal results are displayed) Labs Reviewed  RAPID STREP SCREEN (NOT AT [facility])  CULTURE, GROUP A STREP [facility])    EKG None  Radiology No results found.  Procedures Procedures (including critical care time)  Medications Ordered in ED Medications - No data to display   Initial Impression / Assessment and Plan / ED Course  I have reviewed the triage vital signs and the nursing notes.  Pertinent labs & imaging results that  were available during my care of the patient were reviewed by me and considered in my medical decision making (see chart for details).     Patient presenting with several weeks of throat irritation.  Physical exam shows tonsilloliths on the left tonsil.  Patient is afebrile not tachycardic.  No respiratory distress or airway compromise.  Discussed findings with patient. Strep negative. Discussed tx with abx and f/u.  At this time, patient received for discharge.  Return precautions given.  Patient states she understands and agrees to plan.   Final Clinical Impressions(s) / ED Diagnoses   Final diagnoses:  Tonsillolith    ED Discharge Orders        Ordered    penicillin v potassium (VEETID) 500 MG tablet  4 times daily     11/16/17 1348    phenol (CHLORASEPTIC) 1.4 % LIQD  As needed     11/16/17 1348       Alajia Schmelzer, PA-C 11/16/17 1656    Alvira Monday, MD 11/17/17 [phone]

## 2017-11-16 NOTE — Discharge Instructions (Addendum)
Take penicillin as prescribed.  Take the entire course, even if your symptoms improve. Use phenol spray as needed for throat irritation. Use Tylenol or ibuprofen as needed for pain. Make sure you are staying well-hydrated with water. Follow up with [facility] for primary care.  Follow up with the ear nose and throat doctor if your throat still hurts.  Follow up with the health department for further evaluation of your vaginal discharge.  Return to the ER if you have difficulty breathing because your throat is swelling, inability to swallow, or any new or concerning symptoms.

## 2017-11-18 LAB — CULTURE, GROUP A STREP (THRC)

## 2019-12-02 ENCOUNTER — Ambulatory Visit: Payer: Medicaid Other | Attending: Internal Medicine

## 2019-12-02 DIAGNOSIS — Z23 Encounter for immunization: Secondary | ICD-10-CM

## 2019-12-02 NOTE — Progress Notes (Signed)
   Covid-19 Vaccination Clinic  Name:  Tanya [name]    MRN: [id number] DOB: April 12, 1976  12/02/2019  [name] was observed post Covid-19 immunization for 15 minutes without incident. She was provided with Vaccine Information Sheet and instruction to access the V-Safe system.   [name] was instructed to call 911 with any severe reactions post vaccine: Marland Kitchen Difficulty breathing  . Swelling of face and throat  . A fast heartbeat  . A bad rash all over body  . Dizziness and weakness   Immunizations Administered    Name Date Dose VIS Date Route   Pfizer COVID-19 Vaccine 12/02/2019  9:51 AM 0.3 mL 07/30/2019 Intramuscular   Manufacturer: [employer], [employer]   Lot: [id number]   NDC: [address]-2

## 2019-12-27 ENCOUNTER — Ambulatory Visit: Payer: Medicaid Other | Attending: Internal Medicine

## 2019-12-27 DIAGNOSIS — Z23 Encounter for immunization: Secondary | ICD-10-CM

## 2019-12-27 NOTE — Progress Notes (Signed)
   Covid-19 Vaccination Clinic  Name:  Tanya [name]    MRN: [id number] DOB: Oct 08, 1975  12/27/2019  [name] was observed post Covid-19 immunization for 15 minutes without incident. She was provided with Vaccine Information Sheet and instruction to access the V-Safe system.   [name] was instructed to call 911 with any severe reactions post vaccine: Marland Kitchen Difficulty breathing  . Swelling of face and throat  . A fast heartbeat  . A bad rash all over body  . Dizziness and weakness   Immunizations Administered    Name Date Dose VIS Date Route   Pfizer COVID-19 Vaccine 12/27/2019  9:45 AM 0.3 mL 10/13/2018 Intramuscular   Manufacturer: [employer], [employer]   Lot: [id number]   NDC: [address]-2

## 2020-03-29 ENCOUNTER — Encounter (HOSPITAL_COMMUNITY): Payer: Self-pay | Admitting: Emergency Medicine

## 2020-03-29 ENCOUNTER — Other Ambulatory Visit: Payer: Self-pay

## 2020-03-29 ENCOUNTER — Ambulatory Visit (HOSPITAL_COMMUNITY)
Admission: EM | Admit: 2020-03-29 | Discharge: 2020-03-29 | Disposition: A | Discharge status: Home / Self Care | Payer: Medicaid Other | Attending: Urgent Care | Admitting: Urgent Care

## 2020-03-29 DIAGNOSIS — T1592XA Foreign body on external eye, part unspecified, left eye, initial encounter: Secondary | ICD-10-CM

## 2020-03-29 DIAGNOSIS — H5712 Ocular pain, left eye: Secondary | ICD-10-CM

## 2020-03-29 DIAGNOSIS — H53142 Visual discomfort, left eye: Secondary | ICD-10-CM

## 2020-03-29 MED ORDER — NAPROXEN 500 MG PO TABS: 500.0000 mg | ORAL_TABLET | Freq: Two times a day (BID) | ORAL | 0 refills | Status: DC

## 2020-03-29 MED ORDER — TOBRAMYCIN 0.3 % OP SOLN: 1.0000 [drp] | OPHTHALMIC | 0 refills | Status: AC

## 2020-03-29 NOTE — ED Provider Notes (Signed)
[facility]   MRN: [id number] DOB: 12-01-1975  Subjective:   Tanya [name] is a 44 y.o. female presenting for 1 week history of persistent left eye discomfort.  Patient states that his caused a lot of tearing of her eye, now having a headache.  Denies eye trauma, eye drainage, matted eyes, vision change, photophobia, runny or stuffy nose, ear pain.  Has not tried medications for relief.  No current facility-administered medications for this encounter.  Current Outpatient Medications:  .  albuterol (PROVENTIL HFA;VENTOLIN HFA) 108 (90 BASE) MCG/ACT inhaler, Inhale 2 puffs into the lungs every 6 (six) hours as needed for wheezing or shortness of breath., Disp: 1 Inhaler, Rfl: 2 .  HYDROcodone-acetaminophen (NORCO/VICODIN) 5-325 MG tablet, Take 1-2 tablets by mouth every 4 (four) hours as needed., Disp: 12 tablet, Rfl: 0 .  insulin glargine (LANTUS) 100 UNIT/ML injection, Inject 15 Units into the skin at bedtime., Disp: 10 mL, Rfl: 3 .  ipratropium (ATROVENT) 0.06 % nasal spray, Place 2 sprays into both nostrils 4 (four) times daily., Disp: 15 mL, Rfl: 1 .  ondansetron (ZOFRAN) 4 MG tablet, Take 1 tablet (4 mg total) by mouth every 6 (six) hours. Prn n/v., Disp: 8 tablet, Rfl: 0 .  SUMAtriptan (IMITREX) 100 MG tablet, Take 1 tablet at earliest onset of headache.  May repeat in 2 hours if headache persists/recurs.  Do not exceed 2 tablets in 24 hours, Disp: 10 tablet, Rfl: 2   Allergies  Allergen Reactions  . Quinine Derivatives Hives    Past Medical History:  Diagnosis Date  . Diabetes mellitus without complication (HCC)   . Hypertension      Past Surgical History:  Procedure Laterality Date  . ABDOMINAL SURGERY      History reviewed. No pertinent family history.  Social History   Tobacco Use  . Smoking status: Never Smoker  . Smokeless tobacco: Never Used  Substance Use Topics  . Alcohol use: No  . Drug use: No    ROS   Objective:   Vitals: BP (!) 149/94 (BP  Location: Right Arm)   Pulse 87   Temp 98.2 F (36.8 C) (Oral)   Resp 18   SpO2 100%   Physical Exam Constitutional:      General: She is not in acute distress.    Appearance: Normal appearance. She is well-developed. She is not ill-appearing, toxic-appearing or diaphoretic.  HENT:     Head: Normocephalic and atraumatic.     Right Ear: Tympanic membrane, ear canal and external ear normal. No drainage or tenderness. No middle ear effusion. Tympanic membrane is not erythematous.     Left Ear: Tympanic membrane, ear canal and external ear normal. No drainage or tenderness.  No middle ear effusion. Tympanic membrane is not erythematous.     Nose: No congestion or rhinorrhea.     Mouth/Throat:     Mouth: Mucous membranes are moist. No oral lesions.     Pharynx: Oropharynx is clear. No pharyngeal swelling, oropharyngeal exudate, posterior oropharyngeal erythema or uvula swelling.     Tonsils: No tonsillar exudate or tonsillar abscesses.  Eyes:     General: No scleral icterus.       Right eye: No foreign body, discharge or hordeolum.        Left eye: Foreign body present.No discharge or hordeolum.     Extraocular Movements: Extraocular movements intact.     Right eye: Normal extraocular motion.     Left eye: Normal extraocular motion.  Conjunctiva/sclera: Conjunctivae normal.     Right eye: Right conjunctiva is not injected. No chemosis, exudate or hemorrhage.    Left eye: Left conjunctiva is not injected. No chemosis, exudate or hemorrhage.    Pupils: Pupils are equal, round, and reactive to light.   Cardiovascular:     Rate and Rhythm: Normal rate.  Pulmonary:     Effort: Pulmonary effort is normal.  Musculoskeletal:     Cervical back: Normal range of motion and neck supple.  Lymphadenopathy:     Cervical: No cervical adenopathy.  Skin:    General: Skin is warm and dry.  Neurological:     General: No focal deficit present.     Mental Status: She is alert and oriented to  person, place, and time.     Cranial Nerves: No cranial nerve deficit.     Motor: No weakness.     Coordination: Coordination normal.     Gait: Gait normal.     Deep Tendon Reflexes: Reflexes normal.  Psychiatric:        Mood and Affect: Mood normal.        Behavior: Behavior normal.        Thought Content: Thought content normal.        Judgment: Judgment normal.    Eye Exam: Eyelids everted and swept for foreign body. The eye was stained with fluorescein. Examination under woods lamp does not reveal area of increased stain uptake.   The eye was irrigated copiously with saline due to suspicion of possible foreign body overlying the cornea.  Thereafter the area was clear and the patient reported significant improvement.   Assessment and Plan :   PDMP not reviewed this encounter.  1. Left eye pain   2. Photophobia of left eye   3. Eye foreign body, left, initial encounter     Suspect small foreign bodies that were irrigated on exam.  Recommended supportive care, close monitoring.  Suspect the headache was related to her eye discomfort.  Patient and her husband were in agreement with this.  No signs of intracranial process related to her headache, blood pressure and eye pain.  Emphasized need for close follow-up with her ophthalmologist in 2 days.  Counseled patient on potential for adverse effects with medications prescribed/recommended today, ER and return-to-clinic precautions discussed, patient verbalized understanding.    Wallis Bamberg, PA-C 03/29/20 1454

## 2020-03-29 NOTE — ED Triage Notes (Signed)
Pt presents to [facility] for assessment of left eye pain x 1 week with pain around the eye, as well as headache.  Lots of tearing.

## 2020-04-05 ENCOUNTER — Other Ambulatory Visit: Payer: Self-pay | Admitting: Internal Medicine

## 2020-04-05 DIAGNOSIS — Z1231 Encounter for screening mammogram for malignant neoplasm of breast: Secondary | ICD-10-CM

## 2020-04-27 ENCOUNTER — Encounter (HOSPITAL_COMMUNITY): Payer: Self-pay

## 2020-04-27 ENCOUNTER — Emergency Department (HOSPITAL_COMMUNITY)
Admission: EM | Admit: 2020-04-27 | Discharge: 2020-04-28 | Disposition: A | Discharge status: Home / Self Care | Payer: Medicaid Other | Attending: Emergency Medicine | Admitting: Emergency Medicine

## 2020-04-27 ENCOUNTER — Other Ambulatory Visit: Payer: Self-pay

## 2020-04-27 ENCOUNTER — Emergency Department (HOSPITAL_COMMUNITY): Payer: Medicaid Other

## 2020-04-27 DIAGNOSIS — I10 Essential (primary) hypertension: Secondary | ICD-10-CM

## 2020-04-27 DIAGNOSIS — Z794 Long term (current) use of insulin: Secondary | ICD-10-CM | POA: Insufficient documentation

## 2020-04-27 DIAGNOSIS — J45909 Unspecified asthma, uncomplicated: Secondary | ICD-10-CM | POA: Diagnosis not present

## 2020-04-27 DIAGNOSIS — E1110 Type 2 diabetes mellitus with ketoacidosis without coma: Secondary | ICD-10-CM | POA: Diagnosis not present

## 2020-04-27 DIAGNOSIS — R079 Chest pain, unspecified: Secondary | ICD-10-CM | POA: Insufficient documentation

## 2020-04-27 LAB — CBC
HCT: 42.6 % (ref 36.0–46.0)
Hemoglobin: 13.4 g/dL (ref 12.0–15.0)
MCH: 27 pg (ref 26.0–34.0)
MCHC: 31.5 g/dL (ref 30.0–36.0)
MCV: 85.9 fL (ref 80.0–100.0)
Platelets: 231 10*3/uL (ref 150–400)
RBC: 4.96 MIL/uL (ref 3.87–5.11)
RDW: 14.6 % (ref 11.5–15.5)
WBC: 6.8 10*3/uL (ref 4.0–10.5)
nRBC: 0 % (ref 0.0–0.2)

## 2020-04-27 LAB — BASIC METABOLIC PANEL
Anion gap: 10 (ref 5–15)
BUN: 12 mg/dL (ref 6–20)
CO2: 23 mmol/L (ref 22–32)
Calcium: 9.4 mg/dL (ref 8.9–10.3)
Chloride: 108 mmol/L (ref 98–111)
Creatinine, Ser: 0.91 mg/dL (ref 0.44–1.00)
GFR calc Af Amer: >60 mL/min (ref >60)
GFR calc non Af Amer: >60 mL/min (ref >60)
Glucose, Bld: 84 mg/dL (ref 70–99)
Potassium: 3.9 mmol/L (ref 3.5–5.1)
Sodium: 141 mmol/L (ref 135–145)

## 2020-04-27 LAB — TROPONIN I (HIGH SENSITIVITY): Troponin I (High Sensitivity): <2 ng/L (ref <18)

## 2020-04-27 LAB — I-STAT BETA HCG BLOOD, ED (NOT ORDERABLE): I-stat hCG, quantitative: <5 m[IU]/mL (ref <5)

## 2020-04-27 NOTE — ED Triage Notes (Addendum)
Patient c/o intermittent left chest pain x 2 weeks. Patient states chest pain worse with a deep breath.  Patient speaks [place] from the [place]. Language is not in the  Video interpreter or  the audio interpreter. Husband is available.

## 2020-04-28 LAB — TROPONIN I (HIGH SENSITIVITY): Troponin I (High Sensitivity): <2 ng/L (ref <18)

## 2020-04-28 MED ORDER — LIDOCAINE VISCOUS HCL 2 % MT SOLN
15.0000 mL | Freq: Once | OROMUCOSAL | Status: AC
  Administered 2020-04-28: 15 mL via ORAL
  Filled 2020-04-28: qty 15

## 2020-04-28 MED ORDER — ALUM & MAG HYDROXIDE-SIMETH 200-200-20 MG/5ML PO SUSP
30.0000 mL | Freq: Once | ORAL | Status: AC
  Administered 2020-04-28: 30 mL via ORAL
  Filled 2020-04-28: qty 30

## 2020-04-28 NOTE — ED Provider Notes (Addendum)
[facility]-EMERGENCY DEPT Provider Note   CSN: [id number] Arrival date & time: 04/27/20  1748     History Chief Complaint  Patient presents with  . Chest Pain    Tanya [name] is a 44 y.o. female.  Patient with history of DM, asthma, migraine headaches to ED with c/o nonradiating, central chest pain that is described as intermittent, not sharp but can only say "it is irritating". She started having symptoms 2 weeks ago. No aggravating or alleviating factors. She denies the pain is exertional. No shortness of breath or pain with breathing. No nausea, vomiting or diaphoresis.   The history is provided by the patient. A language interpreter was used.  Chest Pain Associated symptoms: no cough, no diaphoresis, no fever, no nausea and no shortness of breath        Past Medical History:  Diagnosis Date  . Diabetes mellitus without complication (HCC)   . Hypertension     Patient Active Problem List   Diagnosis Date Noted  . Newly diagnosed diabetes (HCC) 10/20/2011  . DKA (diabetic ketoacidoses) (HCC) 10/20/2011    Past Surgical History:  Procedure Laterality Date  . ABDOMINAL SURGERY       OB History    Gravida  3   Para  3   Term  3   Preterm      AB      Living  3     SAB      TAB      Ectopic      Multiple      Live Births  3           History reviewed. No pertinent family history.  Social History   Tobacco Use  . Smoking status: Never Smoker  . Smokeless tobacco: Never Used  Vaping Use  . Vaping Use: Never used  Substance Use Topics  . Alcohol use: No  . Drug use: No    Home Medications Prior to Admission medications   Medication Sig Start Date End Date Taking? Authorizing Provider  albuterol (PROVENTIL HFA;VENTOLIN HFA) 108 (90 BASE) MCG/ACT inhaler Inhale 2 puffs into the lungs every 6 (six) hours as needed for wheezing or shortness of breath. 07/06/14   Rodolph Bong, MD  HYDROcodone-acetaminophen  (NORCO/VICODIN) 5-325 MG tablet Take 1-2 tablets by mouth every 4 (four) hours as needed. 01/23/16   Elpidio Anis, PA-C  insulin glargine (LANTUS) 100 UNIT/ML injection Inject 15 Units into the skin at bedtime. 10/21/11 01/23/16  Regalado, Belkys A, MD  ipratropium (ATROVENT) 0.06 % nasal spray Place 2 sprays into both nostrils 4 (four) times daily. 07/06/14   Rodolph Bong, MD  naproxen (NAPROSYN) 500 MG tablet Take 1 tablet (500 mg total) by mouth 2 (two) times daily with a meal. 03/29/20   Wallis Bamberg, PA-C  ondansetron (ZOFRAN) 4 MG tablet Take 1 tablet (4 mg total) by mouth every 6 (six) hours. Prn n/v. 08/09/16   Linna Hoff, MD  SUMAtriptan (IMITREX) 100 MG tablet Take 1 tablet at earliest onset of headache.  May repeat in 2 hours if headache persists/recurs.  Do not exceed 2 tablets in 24 hours 11/12/16   Shon Millet R, DO  tobramycin (TOBREX) 0.3 % ophthalmic solution Place 1 drop into the left eye every 4 (four) hours. 03/29/20   Wallis Bamberg, PA-C  nortriptyline (PAMELOR) 25 MG capsule Take 1 capsule (25 mg total) by mouth at bedtime. 11/12/16 03/29/20  Drema Dallas, DO  Allergies    Quinine derivatives  Review of Systems   Review of Systems  Constitutional: Negative for chills, diaphoresis and fever.  HENT: Negative.   Respiratory: Negative.  Negative for cough and shortness of breath.   Cardiovascular: Positive for chest pain.  Gastrointestinal: Negative.  Negative for nausea.  Musculoskeletal: Negative.   Skin: Negative.   Neurological: Negative.     Physical Exam Updated Vital Signs BP (!) 164/98   Pulse 78   Temp 98.1 F (36.7 C) (Oral)   Resp 15   Ht 5\' 4"  (1.626 m)   Wt 81.6 kg   SpO2 99%   BMI 30.90 kg/m   Physical Exam Vitals and nursing note reviewed.  Constitutional:      General: She is not in acute distress.    Appearance: She is well-developed.  HENT:     Head: Normocephalic.  Cardiovascular:     Rate and Rhythm: Normal rate and regular rhythm.      Heart sounds: No murmur heard.   Pulmonary:     Effort: Pulmonary effort is normal.     Breath sounds: Normal breath sounds. No wheezing, rhonchi or rales.  Chest:     Chest wall: No tenderness.  Abdominal:     General: Bowel sounds are normal.     Palpations: Abdomen is soft.     Tenderness: There is no abdominal tenderness. There is no guarding or rebound.  Musculoskeletal:        General: Normal range of motion.     Cervical back: Normal range of motion and neck supple.     Right lower leg: No edema.     Left lower leg: No edema.  Skin:    General: Skin is warm and dry.     Findings: No rash.  Neurological:     Mental Status: She is alert.     Cranial Nerves: No cranial nerve deficit.     ED Results / Procedures / Treatments   Labs (all labs ordered are listed, but only abnormal results are displayed) Labs Reviewed  BASIC METABOLIC PANEL  CBC  I-STAT BETA HCG BLOOD, ED ([facility], [facility], [facility] ONLY)  I-STAT BETA HCG BLOOD, ED (NOT ORDERABLE)  TROPONIN I (HIGH SENSITIVITY)  TROPONIN I (HIGH SENSITIVITY)    EKG None  Radiology DG Chest 2 View  Result Date: 04/27/2020 CLINICAL DATA:  Patient c/o intermittent left chest pain x 2 weeks. Patient states chest pain worse with a deep breath.chest pain EXAM: CHEST - 2 VIEW COMPARISON:  None. FINDINGS: Normal mediastinum and cardiac silhouette. Normal pulmonary vasculature. No evidence of effusion, infiltrate, or pneumothorax. No acute bony abnormality. IMPRESSION: No acute cardiopulmonary process. Electronically Signed   By: 06/27/2020 M.D.   On: 04/27/2020 18:48    Procedures Procedures (including critical care time)  Medications Ordered in ED Medications  alum & mag hydroxide-simeth (MAALOX/MYLANTA) 200-200-20 MG/5ML suspension 30 mL (30 mLs Oral Given 04/28/20 0138)    And  lidocaine (XYLOCAINE) 2 % viscous mouth solution 15 mL (15 mLs Oral Given 04/28/20 0138)    ED Course  I have reviewed the triage vital signs and the  nursing notes.  Pertinent labs & imaging results that were available during my care of the patient were reviewed by me and considered in my medical decision making (see chart for details).    MDM Rules/Calculators/A&P  Patient to ED with c/o chest pain x 2 weeks, further described in the HPI.   She is well appearing. VSS, persistently hypertensive in the ED. She denies ever taking medication for same. Labs are unremarkable, EKG nonacute. CXR negative. Delta troponin felt reasonable given elevated pressures in ED and ongoing pain, however, pain seems atypical for ACS. GI cocktail given with complete resolution of symptoms.   Will strongly encourage close follow up with PCP to consider  blood pressure medication, and for follow up on nonspecific chest pain. Recommend use of Prilosec daily.    Final Clinical Impression(s) / ED Diagnoses Final diagnoses:  None   1. Nonspecific chest pain 2. Hypertension   Rx / DC Orders ED Discharge Orders    None       Elpidio Anis, PA-C 04/28/20 0302    Elpidio Anis, PA-C 04/28/20 0454    Gilda Crease, MD 04/28/20 [phone]

## 2020-04-28 NOTE — Discharge Instructions (Signed)
Your chest pain is not felt to be heart related. Recommend Prilosec daily for symptoms.   It is strongly recommended that you see your doctor in close follow up (1-2 days) for recheck of your elevated blood pressure to consider starting on medications for control.

## 2020-09-13 ENCOUNTER — Encounter (HOSPITAL_COMMUNITY): Payer: Self-pay | Admitting: Emergency Medicine

## 2020-09-13 ENCOUNTER — Other Ambulatory Visit: Payer: Self-pay

## 2020-09-13 ENCOUNTER — Ambulatory Visit (HOSPITAL_COMMUNITY)
Admission: EM | Admit: 2020-09-13 | Discharge: 2020-09-13 | Disposition: A | Discharge status: Home / Self Care | Payer: Medicaid Other | Attending: Medical Oncology | Admitting: Medical Oncology

## 2020-09-13 DIAGNOSIS — H5789 Other specified disorders of eye and adnexa: Secondary | ICD-10-CM | POA: Diagnosis not present

## 2020-09-13 DIAGNOSIS — G43719 Chronic migraine without aura, intractable, without status migrainosus: Secondary | ICD-10-CM | POA: Diagnosis not present

## 2020-09-13 NOTE — ED Triage Notes (Signed)
Pt presents with headache on left side that radiates behind ear. Also c/o of runny nose. States is having blurry vision on left side.

## 2020-09-13 NOTE — ED Provider Notes (Signed)
[facility]    CSN: [id number] Arrival date & time: 09/13/20  1048      History   Chief Complaint Chief Complaint  Patient presents with  . Headache    HPI Marykatherine Sherwood is a 45 y.o. female.   She presents with her husband who she elects to translate for her instead of our translation service.  Patient is able to intermittently answer questions on her own as well.  HPI   Headache: Patient with a past medical history of chronic migraine presents with a 7 day history of left-sided ear and scalp pain along with a 1 day history of headache.  In addition she has noticed some mild blurry vision of the left side along with clear watery discharge of the left eye and rhinorrhea of bilateral naris.  She denies any fever, neck stiffness, rash, loss of hearing, vomiting.  She states that her head pain feels similar to her chronic migraines.  Of note she was seen on March 29, 2020 for left eye pain and had a formal eye exam per patient and husband about 1 week thereafter which was normal. She does not wear contacts.   Past Medical History:  Diagnosis Date  . Diabetes mellitus without complication (HCC)   . Hypertension     Patient Active Problem List   Diagnosis Date Noted  . Newly diagnosed diabetes (HCC) 10/20/2011  . DKA (diabetic ketoacidoses) 10/20/2011    Past Surgical History:  Procedure Laterality Date  . ABDOMINAL SURGERY      OB History    Gravida  3   Para  3   Term  3   Preterm      AB      Living  3     SAB      IAB      Ectopic      Multiple      Live Births  3            Home Medications    Prior to Admission medications   Medication Sig Start Date End Date Taking? Authorizing Provider  albuterol (PROVENTIL HFA;VENTOLIN HFA) 108 (90 BASE) MCG/ACT inhaler Inhale 2 puffs into the lungs every 6 (six) hours as needed for wheezing or shortness of breath. 07/06/14   Rodolph Bong, MD  HYDROcodone-acetaminophen (NORCO/VICODIN) 5-325 MG  tablet Take 1-2 tablets by mouth every 4 (four) hours as needed. 01/23/16   Elpidio Anis, PA-C  insulin glargine (LANTUS) 100 UNIT/ML injection Inject 15 Units into the skin at bedtime. 10/21/11 01/23/16  Regalado, Belkys A, MD  ipratropium (ATROVENT) 0.06 % nasal spray Place 2 sprays into both nostrils 4 (four) times daily. 07/06/14   Rodolph Bong, MD  naproxen (NAPROSYN) 500 MG tablet Take 1 tablet (500 mg total) by mouth 2 (two) times daily with a meal. 03/29/20   Wallis Bamberg, PA-C  ondansetron (ZOFRAN) 4 MG tablet Take 1 tablet (4 mg total) by mouth every 6 (six) hours. Prn n/v. 08/09/16   Linna Hoff, MD  SUMAtriptan (IMITREX) 100 MG tablet Take 1 tablet at earliest onset of headache.  May repeat in 2 hours if headache persists/recurs.  Do not exceed 2 tablets in 24 hours 11/12/16   Shon Millet R, DO  tobramycin (TOBREX) 0.3 % ophthalmic solution Place 1 drop into the left eye every 4 (four) hours. 03/29/20   Wallis Bamberg, PA-C  nortriptyline (PAMELOR) 25 MG capsule Take 1 capsule (25 mg total) by mouth at bedtime.  11/12/16 03/29/20  Drema Dallas, DO    Family History History reviewed. No pertinent family history.  Social History Social History   Tobacco Use  . Smoking status: Never Smoker  . Smokeless tobacco: Never Used  Vaping Use  . Vaping Use: Never used  Substance Use Topics  . Alcohol use: No  . Drug use: No     Allergies   Quinine derivatives   Review of Systems Review of Systems  As stated above in HPI  Physical Exam Triage Vital Signs ED Triage Vitals  Enc Vitals Group     BP 09/13/20 1205 123/89     Pulse Rate 09/13/20 1205 85     Resp 09/13/20 1205 16     Temp 09/13/20 1205 98.7 F (37.1 C)     Temp Source 09/13/20 1205 Oral     SpO2 09/13/20 1205 97 %     Weight --      Height --      Head Circumference --      Peak Flow --      Pain Score 09/13/20 1212 8     Pain Loc --      Pain Edu? --      Excl. in GC? --    No data found.  Updated Vital  Signs BP 123/89 (BP Location: Left Arm)   Pulse 85   Temp 98.7 F (37.1 C) (Oral)   Resp 16   SpO2 97%   Visual Acuity Unable to obtain due to language barrier though attempted with shapes and letters.    Physical Exam Vitals and nursing note reviewed.  Constitutional:      General: She is not in acute distress.    Appearance: She is not ill-appearing, toxic-appearing or diaphoretic.  HENT:     Head: Normocephalic. No right periorbital erythema or left periorbital erythema.     Jaw: No tenderness or pain on movement.     Right Ear: Hearing and tympanic membrane normal. No swelling. Tympanic membrane is not erythematous.     Left Ear: Hearing and tympanic membrane normal. No swelling. Tympanic membrane is not erythematous.     Mouth/Throat:     Mouth: Mucous membranes are moist.     Pharynx: Oropharynx is clear.  Eyes:     General: Lids are normal. Vision grossly intact. No visual field deficit or scleral icterus.       Right eye: No foreign body.        Left eye: No foreign body.     Extraocular Movements: Extraocular movements intact.     Right eye: Normal extraocular motion and no nystagmus.     Left eye: Normal extraocular motion and no nystagmus.     Conjunctiva/sclera:     Right eye: Right conjunctiva is not injected. No hemorrhage.    Left eye: Left conjunctiva is not injected. No hemorrhage.    Pupils: Pupils are equal, round, and reactive to light. Pupils are equal.     Right eye: Pupil is round and reactive.     Left eye: Pupil is round and reactive.     Comments: Increase of clear discharge from the left eye. No erythema.   No photophobia or foreign body noted.  No expothalmos  Cardiovascular:     Rate and Rhythm: Normal rate and regular rhythm.     Heart sounds: Normal heart sounds.  Pulmonary:     Effort: Pulmonary effort is normal.     Breath sounds: Normal breath  sounds.  Musculoskeletal:        General: Normal range of motion.     Cervical back: Normal  range of motion and neck supple. No rigidity.  Lymphadenopathy:     Cervical: No cervical adenopathy.  Skin:    General: Skin is warm.     Findings: No rash.  Neurological:     Mental Status: She is alert and oriented to person, place, and time.     Cranial Nerves: No cranial nerve deficit or facial asymmetry.     Coordination: Coordination normal.     Gait: Gait normal.    UC Treatments / Results  Labs (all labs ordered are listed, but only abnormal results are displayed) Labs Reviewed - No data to display  Radiology No results found.  Procedures Procedures (including critical care time)  Medications Ordered in UC Medications - No data to display  Initial Impression / Assessment and Plan / UC Course  I have reviewed the triage vital signs and the nursing notes.  Pertinent labs & imaging results that were available during my care of the patient were reviewed by me and considered in my medical decision making (see chart for details).    New. Vitals are stable. Wide differential which I have discussed with patient and her husband. I would recommend she be seen by her eye specialist within 48 hours. Should rash or facial muscle weakness develop she will need to be seen again by our office of the ER ASAP. Also recommend a follow up with her neurologist. Discussed red flag symptoms that would warrant immediate medical attention. For now I would recommend that she take her normal migraine medication which she has on hand and follow close symptom monitoring. At this time starting her on antivirals or steroids create more risk for side effects than benefits.     Final Clinical Impressions(s) / UC Diagnoses   Final diagnoses:  None   Discharge Instructions   None    ED Prescriptions    None     PDMP not reviewed this encounter.   Rushie Chestnut, [place] 09/13/20 1655

## 2020-09-18 ENCOUNTER — Ambulatory Visit (INDEPENDENT_AMBULATORY_CARE_PROVIDER_SITE_OTHER): Payer: Medicaid Other | Admitting: Neurology

## 2020-09-18 ENCOUNTER — Encounter: Payer: Self-pay | Admitting: Neurology

## 2020-09-18 DIAGNOSIS — G4489 Other headache syndrome: Secondary | ICD-10-CM

## 2020-09-18 MED ORDER — TOPIRAMATE 25 MG PO TABS: ORAL_TABLET | ORAL | 3 refills | Status: DC

## 2020-09-18 NOTE — Progress Notes (Signed)
Reason for visit: Headache  Referring physician: Dr. Darcella [name] is a 45 y.o. female  History of present illness:  [name] is a 45 year old right-handed black female from [place] with a history of essentially daily headaches since age 89.  The patient comes in with her husband today who acts as her interpreter but unfortunately he does not have a high level of English speaking skills.  Apparently, the patient was seen previously by Dr. Everlena [name] in 2018.  She was placed on nortriptyline but never followed up afterwards.  The patient herself does not recall whether the medication was tolerated or whether it helped.  They have been given a prescription for Imitrex that she is taking frequently for her headache.  The headache is generally in the left frontotemporal area, never anywhere else, with spread into the left ear area and sometimes associated with left neck pain.  The patient denies any nausea or vomiting but she does have photophobia and phonophobia.  She may have frequent tearing of the left eye.  She reports a heavy sensation on the right arm and right leg that has been present for 1 year.  She denies any dizziness or loss of vision.  Both parents had no headache but the patient has 1 brother and 5 sisters, and all her siblings have headache.  She is sent here for further evaluation.  Past Medical History:  Diagnosis Date  . Diabetes mellitus without complication (HCC)   . Hypertension     Past Surgical History:  Procedure Laterality Date  . ABDOMINAL SURGERY      History reviewed. No pertinent family history.  Social history:  reports that she has never smoked. She has never used smokeless tobacco. She reports that she does not drink alcohol and does not use drugs.  Medications:  Prior to Admission medications   Medication Sig Start Date End Date Taking? Authorizing Provider  albuterol (PROVENTIL HFA;VENTOLIN HFA) 108 (90 BASE) MCG/ACT inhaler Inhale 2 puffs into  the lungs every 6 (six) hours as needed for wheezing or shortness of breath. 07/06/14  Yes Rodolph Bong, MD  HYDROcodone-acetaminophen (NORCO/VICODIN) 5-325 MG tablet Take 1-2 tablets by mouth every 4 (four) hours as needed. 01/23/16  Yes Upstill, Shari, PA-C  ipratropium (ATROVENT) 0.06 % nasal spray Place 2 sprays into both nostrils 4 (four) times daily. 07/06/14  Yes Rodolph Bong, MD  naproxen (NAPROSYN) 500 MG tablet Take 1 tablet (500 mg total) by mouth 2 (two) times daily with a meal. 03/29/20  Yes Wallis Bamberg, PA-C  ondansetron (ZOFRAN) 4 MG tablet Take 1 tablet (4 mg total) by mouth every 6 (six) hours. Prn n/v. 08/09/16  Yes Kindl, Quita Skye, MD  SUMAtriptan (IMITREX) 100 MG tablet Take 1 tablet at earliest onset of headache.  May repeat in 2 hours if headache persists/recurs.  Do not exceed 2 tablets in 24 hours 11/12/16  Yes Jaffe, Adam R, DO  tobramycin (TOBREX) 0.3 % ophthalmic solution Place 1 drop into the left eye every 4 (four) hours. 03/29/20  Yes Wallis Bamberg, PA-C  insulin glargine (LANTUS) 100 UNIT/ML injection Inject 15 Units into the skin at bedtime. 10/21/11 01/23/16  Regalado, Jon Billings A, MD  nortriptyline (PAMELOR) 25 MG capsule Take 1 capsule (25 mg total) by mouth at bedtime. 11/12/16 03/29/20  Drema Dallas, DO      Allergies  Allergen Reactions  . Quinine Derivatives Hives    ROS:  Out of a complete 14 system  review of symptoms, the patient complains only of the following symptoms, and all other reviewed systems are negative.  Headache Left eye tearing  Blood pressure 125/86, pulse 81, height 5\' 4"  (1.626 m), weight 177 lb 9.6 oz (80.6 kg).  Physical Exam  General: The patient is alert and cooperative at the time of the examination.  Eyes: Pupils are equal, round, and reactive to light. Discs could not be evaluated, the patient would not cooperate due to photophobia.  Neck: The neck is supple, no carotid bruits are noted.  Respiratory: The respiratory examination is  clear.  Cardiovascular: The cardiovascular examination reveals a regular rate and rhythm, no obvious murmurs or rubs are noted.  Neuromuscular: Range of movement the cervical spine is full.  Skin: Extremities are without significant edema.  Neurologic Exam  Mental status: The patient is alert and oriented x 3 at the time of the examination. The patient has apparent normal recent and remote memory, with an apparently normal attention span and concentration ability.  Cranial nerves: Facial symmetry is present. There is decreased pinprick sensation on the right face as compared to the left, the patient does not split midline with vibration sensation on the forehead. The strength of the facial muscles and the muscles to head turning and shoulder shrug are normal bilaterally. Speech is well enunciated, no aphasia or dysarthria is noted. Extraocular movements are full. Visual fields are full. The tongue is midline, and the patient has symmetric elevation of the soft palate. No obvious hearing deficits are noted.  Motor: The motor testing reveals 5 over 5 strength of all 4 extremities. Good symmetric motor tone is noted throughout.  Sensory: Sensory testing is notable for some decrease in pinprick and vibration sensation on the right arm or right leg as compared to the left, position sense is symmetric throughout. No evidence of extinction is noted.  Coordination: Cerebellar testing reveals good finger-nose-finger and heel-to-shin bilaterally.  Gait and station: Gait is normal. Tandem gait is normal. Romberg is negative. No drift is seen.  Reflexes: Deep tendon reflexes are symmetric and normal bilaterally. Toes are downgoing bilaterally.   Assessment/Plan:  1.  Chronic daily headache  2.  Reported heaviness of the right arm and leg, decreased sensation on the right side  The patient has a longstanding history of frequent headache that has always been on the left frontal temporal area.  Over  the last year apparently the patient has reported some heaviness of the right arm and right leg and offers a decreased sensation on the entire right side on clinical examination today.  She does have risk factors for stroke with diabetes and hypertension.  We will check MRI of the brain therefore.  She will be placed on Topamax working up to 75 mg at night.  She will call for any dose adjustments.  She will follow up here in 3 months.  MD 09/18/2020 11:41 AM  [facility] [address] [place], [place]  Phone [phone] Fax [phone]

## 2020-09-18 NOTE — Patient Instructions (Signed)
We will start Topamax for headache prevention.  Topamax (topiramate) is a seizure medication that has an FDA approval for seizures and for migraine headache. Potential side effects of this medication include weight loss, cognitive slowing, tingling in the fingers and toes, and carbonated drinks will taste bad. If any significant side effects are noted on this drug, please contact our office.  

## 2020-09-19 ENCOUNTER — Telehealth: Payer: Self-pay | Admitting: Neurology

## 2020-09-19 NOTE — Telephone Encounter (Signed)
[facility] pending faxed notes.  °

## 2020-09-25 ENCOUNTER — Other Ambulatory Visit: Payer: Self-pay

## 2020-09-25 ENCOUNTER — Emergency Department (HOSPITAL_COMMUNITY)
Admission: EM | Admit: 2020-09-25 | Discharge: 2020-09-26 | Disposition: A | Discharge status: Home / Self Care | Payer: Medicaid Other | Attending: Emergency Medicine | Admitting: Emergency Medicine

## 2020-09-25 DIAGNOSIS — R0981 Nasal congestion: Secondary | ICD-10-CM | POA: Diagnosis not present

## 2020-09-25 DIAGNOSIS — Z20822 Contact with and (suspected) exposure to covid-19: Secondary | ICD-10-CM | POA: Insufficient documentation

## 2020-09-25 DIAGNOSIS — I10 Essential (primary) hypertension: Secondary | ICD-10-CM | POA: Insufficient documentation

## 2020-09-25 DIAGNOSIS — E1165 Type 2 diabetes mellitus with hyperglycemia: Secondary | ICD-10-CM | POA: Insufficient documentation

## 2020-09-25 DIAGNOSIS — R739 Hyperglycemia, unspecified: Secondary | ICD-10-CM

## 2020-09-25 DIAGNOSIS — J3489 Other specified disorders of nose and nasal sinuses: Secondary | ICD-10-CM | POA: Insufficient documentation

## 2020-09-25 DIAGNOSIS — R5383 Other fatigue: Secondary | ICD-10-CM | POA: Diagnosis present

## 2020-09-25 LAB — URINALYSIS, MICROSCOPIC (REFLEX): Bacteria, UA: NONE SEEN

## 2020-09-25 LAB — CBC
HCT: 39.1 % (ref 36.0–46.0)
Hemoglobin: 13.1 g/dL (ref 12.0–15.0)
MCH: 27.5 pg (ref 26.0–34.0)
MCHC: 33.5 g/dL (ref 30.0–36.0)
MCV: 82.1 fL (ref 80.0–100.0)
Platelets: 198 10*3/uL (ref 150–400)
RBC: 4.76 MIL/uL (ref 3.87–5.11)
RDW: 12.8 % (ref 11.5–15.5)
WBC: 8.7 10*3/uL (ref 4.0–10.5)
nRBC: 0 % (ref 0.0–0.2)

## 2020-09-25 LAB — URINALYSIS, ROUTINE W REFLEX MICROSCOPIC
Bilirubin Urine: NEGATIVE
Glucose, UA: >=500 mg/dL — AB
Ketones, ur: 15 mg/dL — AB
Nitrite: NEGATIVE
Protein, ur: NEGATIVE mg/dL
Specific Gravity, Urine: <1.005 — ABNORMAL LOW (ref 1.005–1.030)
pH: 5 (ref 5.0–8.0)

## 2020-09-25 NOTE — ED Triage Notes (Signed)
Triage completed using french interpreter. Per pt, she "drinks, then urinates, then drinks, then urinates." Pt states she feels she is having an allergic reaction to recently prescribed Topiramate (prescribed Monday), states she has "spots" on her body. No spots noted in triage, airway intact, able to speak in full sentences, NAD noted. States she still has runny nose & headache from 1/26, was seen at [facility] for same.

## 2020-09-25 NOTE — Telephone Encounter (Signed)
schedule for 2/18 830AM

## 2020-09-25 NOTE — Telephone Encounter (Signed)
medicaid [facility] auth: [id number] (exp. 09/19/20 to 11/18/20) order faxed to [facility] bc that is who is in her network with her plan. They will reach out to the patient to schedule.

## 2020-09-26 ENCOUNTER — Telehealth: Payer: Self-pay | Admitting: *Deleted

## 2020-09-26 ENCOUNTER — Encounter (INDEPENDENT_AMBULATORY_CARE_PROVIDER_SITE_OTHER): Payer: Medicaid Other | Admitting: Primary Care

## 2020-09-26 ENCOUNTER — Other Ambulatory Visit: Payer: Self-pay

## 2020-09-26 LAB — BASIC METABOLIC PANEL
Anion gap: 15 (ref 5–15)
BUN: 18 mg/dL (ref 6–20)
CO2: 18 mmol/L — ABNORMAL LOW (ref 22–32)
Calcium: 9.6 mg/dL (ref 8.9–10.3)
Chloride: 98 mmol/L (ref 98–111)
Creatinine, Ser: 1.17 mg/dL — ABNORMAL HIGH (ref 0.44–1.00)
GFR, Estimated: 59 mL/min — ABNORMAL LOW (ref >60)
Glucose, Bld: 816 mg/dL (ref 70–99)
Potassium: 4.4 mmol/L (ref 3.5–5.1)
Sodium: 131 mmol/L — ABNORMAL LOW (ref 135–145)

## 2020-09-26 LAB — SARS CORONAVIRUS 2 (TAT 6-24 HRS): SARS Coronavirus 2: NEGATIVE

## 2020-09-26 LAB — TROPONIN I (HIGH SENSITIVITY): Troponin I (High Sensitivity): 5 ng/L (ref <18)

## 2020-09-26 LAB — CBG MONITORING, ED
Glucose-Capillary: 528 mg/dL (ref 70–99)
Glucose-Capillary: 394 mg/dL — ABNORMAL HIGH (ref 70–99)

## 2020-09-26 LAB — PREGNANCY, URINE: Preg Test, Ur: NEGATIVE

## 2020-09-26 MED ORDER — CETIRIZINE HCL 10 MG PO TABS: 10.0000 mg | ORAL_TABLET | Freq: Every day | ORAL | 0 refills | Status: DC

## 2020-09-26 MED ORDER — METFORMIN HCL 500 MG PO TABS
500.0000 mg | ORAL_TABLET | Freq: Once | ORAL | Status: AC
  Administered 2020-09-26: 500 mg via ORAL
  Filled 2020-09-26: qty 1

## 2020-09-26 MED ORDER — METFORMIN HCL 1000 MG PO TABS: 500.0000 mg | ORAL_TABLET | Freq: Two times a day (BID) | ORAL | 0 refills | Status: AC

## 2020-09-26 MED ORDER — LACTATED RINGERS IV BOLUS
1000.0000 mL | Freq: Once | INTRAVENOUS | Status: AC
  Administered 2020-09-26: 1000 mL via INTRAVENOUS

## 2020-09-26 NOTE — ED Provider Notes (Signed)
MSE was initiated and I personally evaluated the patient and placed orders (if any) at  6:23 AM on September 26, 2020.  Patient is a 45 year old [name] who speaks ewe but is able to communicate through her husband who speaks [place].  Patient states that she has a history of diabetes and used to be on insulin however she was taken off this by her primary care doctor approximately 7 years ago.  She has never had any issues with elevated blood sugar since then.  She states that since Monday she has been peeing more frequently she states that she has needed to drink lots of water.  She denies any fevers, chills, dysuria or abdominal pain.  She states that she does have some chest pain she has difficulty describing this but it appears to be mild and a pressure-like sensation.  She denies any cough but does endorse some sinus congestion and mild headache.  She was prescribed topiramate at urgent care 1/26.  Patient's blood work is notable for blood sugar of 816 She has no anion gap doubt DKA. Urinalysis with evidence of low specific gravity due to osmotic diuresis.  There are some ketones present in urine.  She has had decreased appetite this may be starvation ketosis. CBC without leukocytosis or anemia.  Doubt infectious process driving her hyperglycemia.  We will provide patient with IV fluids CBG on reassessment was 528  CONSTITUTIONAL:  well-appearing, NAD NEURO:  Alert and oriented x 3, no focal deficits EYES:  pupils equal and reactive ENT/NECK:  trachea midline, no JVD CARDIO:  reg rate, reg rhythm, well-perfused PULM:  None labored breathing GI/GU:  Abdomen non-distended MSK/SPINE:  No gross deformities, no edema SKIN:  no rash obvious, atraumatic, no ecchymosis  PSYCH:  Appropriate speech and behavior   The patient appears stable so that the remainder of the MSE may be completed by another provider.   Solon Augusta Goessel, [place] 09/26/20 [id number]    Shon Baton, MD 09/26/20 [phone]

## 2020-09-26 NOTE — ED Provider Notes (Signed)
[facility] EMERGENCY DEPARTMENT Provider Note   CSN: [id number] Arrival date & time: 09/25/20  2214     History Chief Complaint  Patient presents with  . Urinary Frequency    Tanya [name] is a 45 y.o. female with PMH of HTN, DM, and headache disorder followed by [facility] as recently as 09/18/2020 who presents to the ED with urinary complaints.  Patient speaks a Western [place] language for which we do not have translation services.  Her husband is at bedside and he has a limited understanding of [place].  He unfortunately is the best option for translation.  MSE note completed by Solon Augusta PA-C.    I reviewed patient's medical record and she was diagnosed with diabetes in 2013.  However, her A1c was less than 6 and she was informed that she did not require antihyperglycemic medications.  Recently she has been endorsing sinus congestion symptoms and now she is endorsing increased urinary frequency.    On my examination, patient is endorsing a 5-day history of headaches, runny nose, congestion, and ear discomfort.  Patient is fully immunized for COVID-19 and understands that we will test her here in the ED.  She suspects that this is likely a "head cold".  She states that she does not like the way the recent prescribed medication from her neurologist makes her feel.  She states that she is discontinuing for now and will follow up with them on 10/06/2020 for follow-up visit.  She and her husband plan to reestablish with [facility].  Additionally, patient is endorsing fatigue, increased urinary frequency, and increased thirst.  She denies any fevers, difficulty breathing, chills, changes in her bowel habits, dysuria, hematuria, or other symptoms.  HPI     Past Medical History:  Diagnosis Date  . Diabetes mellitus without complication (HCC)   . Hypertension     Patient Active Problem List   Diagnosis Date Noted  .  Newly diagnosed diabetes (HCC) 10/20/2011  . DKA (diabetic ketoacidoses) 10/20/2011    Past Surgical History:  Procedure Laterality Date  . ABDOMINAL SURGERY       OB History    Gravida  3   Para  3   Term  3   Preterm      AB      Living  3     SAB      IAB      Ectopic      Multiple      Live Births  3           No family history on file.  Social History   Tobacco Use  . Smoking status: Never Smoker  . Smokeless tobacco: Never Used  Vaping Use  . Vaping Use: Never used  Substance Use Topics  . Alcohol use: No  . Drug use: No    Home Medications Prior to Admission medications   Medication Sig Start Date End Date Taking? Authorizing Provider  cetirizine (ZYRTEC) 10 MG tablet Take 1 tablet (10 mg total) by mouth daily. 09/26/20  Yes Lorelee New, PA-C  metFORMIN (GLUCOPHAGE) 1000 MG tablet Take 0.5 tablets (500 mg total) by mouth 2 (two) times daily. 09/26/20  Yes Lorelee New, PA-C  albuterol (PROVENTIL HFA;VENTOLIN HFA) 108 (90 BASE) MCG/ACT inhaler Inhale 2 puffs into the lungs every 6 (six) hours as needed for wheezing or shortness of breath. 07/06/14   Rodolph Bong, MD  HYDROcodone-acetaminophen (NORCO/VICODIN) [phone]  MG tablet Take 1-2 tablets by mouth every 4 (four) hours as needed. 01/23/16   Elpidio Anis, PA-C  insulin glargine (LANTUS) 100 UNIT/ML injection Inject 15 Units into the skin at bedtime. 10/21/11 01/23/16  Regalado, Belkys A, MD  ipratropium (ATROVENT) 0.06 % nasal spray Place 2 sprays into both nostrils 4 (four) times daily. 07/06/14   Rodolph Bong, MD  naproxen (NAPROSYN) 500 MG tablet Take 1 tablet (500 mg total) by mouth 2 (two) times daily with a meal. 03/29/20   Wallis Bamberg, PA-C  ondansetron (ZOFRAN) 4 MG tablet Take 1 tablet (4 mg total) by mouth every 6 (six) hours. Prn n/v. 08/09/16   Linna Hoff, MD  SUMAtriptan (IMITREX) 100 MG tablet Take 1 tablet at earliest onset of headache.  May repeat in 2 hours if headache  persists/recurs.  Do not exceed 2 tablets in 24 hours 11/12/16   Shon Millet R, DO  tobramycin (TOBREX) 0.3 % ophthalmic solution Place 1 drop into the left eye every 4 (four) hours. 03/29/20   Wallis Bamberg, PA-C  topiramate (TOPAMAX) 25 MG tablet Take one tablet at night for one week, then take 2 tablets at night for one week, then take 3 tablets at night. 09/18/20   York Spaniel, MD  nortriptyline (PAMELOR) 25 MG capsule Take 1 capsule (25 mg total) by mouth at bedtime. 11/12/16 03/29/20  Drema Dallas, DO    Allergies    Quinine derivatives  Review of Systems   Review of Systems  All other systems reviewed and are negative.   Physical Exam Updated Vital Signs BP 130/89   Pulse 98   Temp 98.6 F (37 C) (Oral)   Resp (!) 22   SpO2 100%   Physical Exam Vitals and nursing note reviewed. Exam conducted with a chaperone present.  Constitutional:      Appearance: Normal appearance.  HENT:     Head: Normocephalic and atraumatic.     Right Ear: Ear canal and external ear normal. There is no impacted cerumen.     Left Ear: Ear canal and external ear normal. There is no impacted cerumen.     Ears:     Comments: No bulging erythematous TMs.  Cone of light nondisplaced.  Can visualize the bony landmarks.    Nose: Congestion and rhinorrhea present.  Eyes:     General: No scleral icterus.    Conjunctiva/sclera: Conjunctivae normal.  Cardiovascular:     Rate and Rhythm: Normal rate.     Pulses: Normal pulses.  Pulmonary:     Effort: Pulmonary effort is normal. No respiratory distress.  Abdominal:     General: Abdomen is flat. There is no distension.     Tenderness: There is no abdominal tenderness.  Skin:    General: Skin is dry.     Capillary Refill: Capillary refill takes less than 2 seconds.  Neurological:     Mental Status: She is alert and oriented to person, place, and time.     GCS: GCS eye subscore is 4. GCS verbal subscore is 5. GCS motor subscore is 6.  Psychiatric:         Mood and Affect: Mood normal.        Behavior: Behavior normal.        Thought Content: Thought content normal.     ED Results / Procedures / Treatments   Labs (all labs ordered are listed, but only abnormal results are displayed) Labs Reviewed  URINALYSIS, ROUTINE W REFLEX  MICROSCOPIC - Abnormal; Notable for the following components:      Result Value   Specific Gravity, Urine <1.005 (*)    Glucose, UA >=500 (*)    Hgb urine dipstick TRACE (*)    Ketones, ur 15 (*)    Leukocytes,Ua TRACE (*)    All other components within normal limits  BASIC METABOLIC PANEL - Abnormal; Notable for the following components:   Sodium 131 (*)    CO2 18 (*)    Glucose, Bld 816 (*)    Creatinine, Ser 1.17 (*)    GFR, Estimated 59 (*)    All other components within normal limits  CBG MONITORING, ED - Abnormal; Notable for the following components:   Glucose-Capillary 528 (*)    All other components within normal limits  CBG MONITORING, ED - Abnormal; Notable for the following components:   Glucose-Capillary 394 (*)    All other components within normal limits  URINE CULTURE  SARS CORONAVIRUS 2 (TAT 6-24 HRS)  CBC  URINALYSIS, MICROSCOPIC (REFLEX)  PREGNANCY, URINE  TROPONIN I (HIGH SENSITIVITY)    EKG EKG Interpretation  Date/Time:  Tuesday September 26 2020 06:44:20 EST Ventricular Rate:  98 PR Interval:    QRS Duration: 86 QT Interval:  351 QTC Calculation: 449 R Axis:   72 Text Interpretation: Sinus rhythm Ventricular premature complex Aberrant complex Short PR interval Abnormal R-wave progression, early transition Consider left ventricular hypertrophy Confirmed by Ross Marcus ([id number]) on 09/26/2020 6:56:39 AM   Radiology No results found.  Procedures Procedures   Medications Ordered in ED Medications  metFORMIN (GLUCOPHAGE) tablet 500 mg (has no administration in time range)  lactated ringers bolus 1,000 mL (1,000 mLs Intravenous New Bag/Given 09/26/20 0620)    ED  Course  I have reviewed the triage vital signs and the nursing notes.  Pertinent labs & imaging results that were available during my care of the patient were reviewed by me and considered in my medical decision making (see chart for details).    MDM Rules/Calculators/A&P                          Myshia Blankinship was evaluated in Emergency Department on 09/26/2020 for the symptoms described in the history of present illness. She was evaluated in the context of the global COVID-19 pandemic, which necessitated consideration that the patient might be at risk for infection with the SARS-CoV-2 virus that causes COVID-19. Institutional protocols and algorithms that pertain to the evaluation of patients at risk for COVID-19 are in a state of rapid change based on information released by regulatory bodies including the CDC and federal and state organizations. These policies and algorithms were followed during the patient's care in the ED.  I personally reviewed patient's medical chart and all notes from triage and staff during today's encounter. I have also ordered and reviewed all labs and imaging that I felt to be medically necessary in the evaluation of this patient's complaints and with consideration of their physical exam. If needed, translation services were available and utilized.   Patient's history, physical exam, and laboratory work-up is suggestive of hyperglycemia.  She presented for polyuria context of hyperglycemia to 816.  No evidence of urinary tract infection.  Anion gap preserved at 15.  CBG improved to 528 before intervention with IV hydration.  Suspect that she can be discharged home with Metformin and instructions to return to [facility] were established with a new  primary care provider for ongoing evaluation management of her diabetes.  Given patient's report of chest tightness, one-time was troponin ordered to assess for ischemia as precipitating factor for her  hyperglycemia.  Urine pregnancy is negative.  Troponin within normal months of 5.  Repeat point-of-care CBG after fluid administration is improved at 394.  Patient is tolerating p.o. intake here in the ED.  Discussed plan with patient and husband including Metformin 500 mg twice daily and antihistamines to help with her ongoing headaches and sinus congestion.  Given that she appears to also be experiencing symptoms of upper respiratory infection, she was tested for COVID-19.  If positive, isolation precautions discussed.  Recommending Tylenol as needed for fever control and continued over-the-counter therapeutics for symptoms as needed.  Transitions-of-care Camellia Wood RN was able to secure patient an appointment with [facility] this afternoon.  Given that CBG has improved from 816-394 after just 1 liter IV NS, feel as though she is appropriate for discharge after being treated with 500 mg Metformin.  She may resume eating and drinking, advised diabetic-friendly diet.  She can have repeat CBG obtained this afternoon at [facility].  ED return precautions discussed.  Patient and husband voice understanding and are agreeable to the plan.  They understand the importance of getting established with a primary care provider.  They also will follow up with their neurologist in 10 days for ongoing management of her headache disorder.  Final Clinical Impression(s) / ED Diagnoses Final diagnoses:  Hyperglycemia    Rx / DC Orders ED Discharge Orders         Ordered    metFORMIN (GLUCOPHAGE) 1000 MG tablet  2 times daily        09/26/20 0831    cetirizine (ZYRTEC) 10 MG tablet  Daily        09/26/20 0831           Lorelee New, PA-C 09/26/20 0932    Terrilee Files, MD 09/26/20 1721

## 2020-09-26 NOTE — Discharge Instructions (Addendum)
Please go to the [place] [facility] today at 1:50 PM to get established with a primary care provider.  I recommend obtaining point-of-care blood glucose at [facility] to ensure continued improvement.  Your blood sugars were elevated over 800 today, improved into 300s with IV hydration.  Given your significant hyperglycemia here in the ED, I have prescribed you metformin 500 mg twice daily.  Your primary care provider may adjust this medication dosage.  I have also started you on Zyrtec given your congestion and rhinorrhea symptoms.  I suspect that it is a viral upper respiratory infection.  You have been tested for COVID-19 which is pending.  I recommend over-the-counter management for symptom control.  Return to the ED or seek me to medical attention should you experience any new or worsening symptoms.

## 2020-09-26 NOTE — Telephone Encounter (Signed)
Pt called regarding cancelled appointment for today.  RNCM called office for clarification.  Office states that since pt has pending COVID test, they have to wait for results.  When results are available, they can call back for an earlier appointment.

## 2020-09-26 NOTE — Progress Notes (Signed)
New Patient Office Visit  Subjective:  Patient ID: Tanya [name], female    DOB: 1976-04-20  Age: 45 y.o. MRN: [id number]  CC: No chief complaint on file.   HPI Tanya [name] presents fo  Past Medical History:  Diagnosis Date  . Diabetes mellitus without complication (HCC)   . Hypertension     Past Surgical History:  Procedure Laterality Date  . ABDOMINAL SURGERY      No family history on file.  Social History   Socioeconomic History  . Marital status: Married    Spouse name: Audrie Gallus  . Number of children: 3  . Years of education: 7th   . Highest education level: Not on file  Occupational History  . Occupation: not working  Tobacco Use  . Smoking status: Never Smoker  . Smokeless tobacco: Never Used  Vaping Use  . Vaping Use: Never used  Substance and Sexual Activity  . Alcohol use: No  . Drug use: No  . Sexual activity: Yes    Birth control/protection: None  Other Topics Concern  . Not on file  Social History Narrative   Lives with husband and 3 children.  Not working.  Education: 7th grade.    Right Handed   Drinks caffeine rarely   Social Determinants of Health   Financial Resource Strain: Not on file  Food Insecurity: Not on file  Transportation Needs: Not on file  Physical Activity: Not on file  Stress: Not on file  Social Connections: Not on file  Intimate Partner Violence: Not on file    ROS Review of Systems  Objective:   Today's Vitals: There were no vitals taken for this visit.  Physical Exam  Assessment & Plan:   Problem List Items Addressed This Visit   None     Outpatient Encounter Medications as of 09/26/2020  Medication Sig  . albuterol (PROVENTIL HFA;VENTOLIN HFA) 108 (90 BASE) MCG/ACT inhaler Inhale 2 puffs into the lungs every 6 (six) hours as needed for wheezing or shortness of breath.  . cetirizine (ZYRTEC) 10 MG tablet Take 1 tablet (10 mg total) by mouth daily.  Marland Kitchen HYDROcodone-acetaminophen (NORCO/VICODIN) 5-325 MG tablet  Take 1-2 tablets by mouth every 4 (four) hours as needed.  . insulin glargine (LANTUS) 100 UNIT/ML injection Inject 15 Units into the skin at bedtime.  Marland Kitchen ipratropium (ATROVENT) 0.06 % nasal spray Place 2 sprays into both nostrils 4 (four) times daily.  . metFORMIN (GLUCOPHAGE) 1000 MG tablet Take 0.5 tablets (500 mg total) by mouth 2 (two) times daily.  . naproxen (NAPROSYN) 500 MG tablet Take 1 tablet (500 mg total) by mouth 2 (two) times daily with a meal.  . ondansetron (ZOFRAN) 4 MG tablet Take 1 tablet (4 mg total) by mouth every 6 (six) hours. Prn n/v.  . SUMAtriptan (IMITREX) 100 MG tablet Take 1 tablet at earliest onset of headache.  May repeat in 2 hours if headache persists/recurs.  Do not exceed 2 tablets in 24 hours  . tobramycin (TOBREX) 0.3 % ophthalmic solution Place 1 drop into the left eye every 4 (four) hours.  . topiramate (TOPAMAX) 25 MG tablet Take one tablet at night for one week, then take 2 tablets at night for one week, then take 3 tablets at night.  . [DISCONTINUED] nortriptyline (PAMELOR) 25 MG capsule Take 1 capsule (25 mg total) by mouth at bedtime.   No facility-administered encounter medications on file as of 09/26/2020.    Follow-up: No follow-ups on file.   Tanya [name]  Harriette Ohara, NP

## 2020-09-27 ENCOUNTER — Telehealth: Payer: Self-pay | Admitting: *Deleted

## 2020-09-27 LAB — URINE CULTURE: Culture: 30000 — AB

## 2020-09-27 NOTE — Telephone Encounter (Signed)
Son Web designer) called regarding new appointment for mom as her results for SARS-CoV-2 target nucleic acids are NOT DETECTED.  RNCM set up appointment with [facility] on 2/23 @0930 . Advised to please arrive 15 min early and take a picture ID and your current medications.  Pt son verbalizes understanding of keeping appointment.

## 2020-10-11 ENCOUNTER — Ambulatory Visit (INDEPENDENT_AMBULATORY_CARE_PROVIDER_SITE_OTHER): Payer: Medicaid Other | Admitting: Primary Care

## 2020-10-11 ENCOUNTER — Encounter (INDEPENDENT_AMBULATORY_CARE_PROVIDER_SITE_OTHER): Payer: Self-pay

## 2020-10-25 ENCOUNTER — Other Ambulatory Visit: Payer: Self-pay

## 2020-10-25 ENCOUNTER — Encounter (INDEPENDENT_AMBULATORY_CARE_PROVIDER_SITE_OTHER): Payer: Self-pay

## 2020-10-25 ENCOUNTER — Encounter (INDEPENDENT_AMBULATORY_CARE_PROVIDER_SITE_OTHER): Payer: Medicaid Other | Admitting: Primary Care

## 2020-11-24 ENCOUNTER — Ambulatory Visit (INDEPENDENT_AMBULATORY_CARE_PROVIDER_SITE_OTHER): Payer: Medicaid Other | Admitting: Primary Care

## 2020-12-25 ENCOUNTER — Encounter: Payer: Self-pay | Admitting: Neurology

## 2020-12-25 ENCOUNTER — Ambulatory Visit: Payer: Medicaid Other | Admitting: Neurology

## 2020-12-25 ENCOUNTER — Telehealth: Payer: Self-pay | Admitting: Neurology

## 2020-12-25 NOTE — Telephone Encounter (Signed)
This patient came in 20 minutes late for her appointment, had to be rescheduled.

## 2021-02-12 ENCOUNTER — Ambulatory Visit: Payer: Medicaid Other | Admitting: Neurology

## 2021-02-12 ENCOUNTER — Encounter: Payer: Self-pay | Admitting: Neurology

## 2021-02-12 ENCOUNTER — Telehealth: Payer: Self-pay | Admitting: Neurology

## 2021-02-12 VITALS — BP 129/84 | HR 85 | Ht 64.0 in | Wt 176.0 lb

## 2021-02-12 DIAGNOSIS — G4489 Other headache syndrome: Secondary | ICD-10-CM | POA: Diagnosis not present

## 2021-02-12 DIAGNOSIS — R519 Headache, unspecified: Secondary | ICD-10-CM

## 2021-02-12 MED ORDER — TOPIRAMATE 25 MG PO TABS: ORAL_TABLET | ORAL | 6 refills | Status: DC

## 2021-02-12 NOTE — Progress Notes (Signed)
I have read the note, and I agree with the clinical assessment and plan.  Tykerria Mccubbins K Grete Bosko   

## 2021-02-12 NOTE — Patient Instructions (Signed)
Restart Topamax working up to 75 mg at bedtime for migraine prevention  Will pursue MRI of the brain  Call for dose adjustment  See you back in 6 months

## 2021-02-12 NOTE — Telephone Encounter (Signed)
Patient needs MRI of the brain, can you assist them in scheduling this.

## 2021-02-12 NOTE — Progress Notes (Signed)
PATIENT: Tanya [name] DOB: 1976/02/02  REASON FOR VISIT: follow up HISTORY FROM: patient Primary Neurologist: Dr. Anne Hahn  Today 02/12/21 Tanya [name] is a 45 year old female with history of daily headaches on the left side. Every 3rd day the headache is significant. Is very poor historian. Here with husband. [place] interpreter arrived, they turned away. Husband translates. Topamax sent last visit I think she may have taken, but then her diabetes worsened, so she stopped, but am not clear, as she doesn't know her medications. She works as a Product/process development scientist, headaches are not bad enough to keep her in the bed.  Denies any continued complaint of right-sided weakness.  No changes to her overall health.  Here today accompanied by her husband.  MRI of the brain was never done.  HISTORY 09/18/2020 Dr. Anne Hahn: [name] is a 45 year old right-handed black female from [place] with a history of essentially daily headaches since age 9.  The patient comes in with her husband today who acts as her interpreter but unfortunately he does not have a high level of English speaking skills.  Apparently, the patient was seen previously by Dr. Everlena Cooper in 2018.  She was placed on nortriptyline but never followed up afterwards.  The patient herself does not recall whether the medication was tolerated or whether it helped.  They have been given a prescription for Imitrex that she is taking frequently for her headache.  The headache is generally in the left frontotemporal area, never anywhere else, with spread into the left ear area and sometimes associated with left neck pain.  The patient denies any nausea or vomiting but she does have photophobia and phonophobia.  She may have frequent tearing of the left eye.  She reports a heavy sensation on the right arm and right leg that has been present for 1 year.  She denies any dizziness or loss of vision.  Both parents had no headache but the patient has 1 brother and 5 sisters, and  all her siblings have headache.  She is sent here for further evaluation.   REVIEW OF SYSTEMS: Out of a complete 14 system review of symptoms, the patient complains only of the following symptoms, and all other reviewed systems are negative.  headache  ALLERGIES: Allergies  Allergen Reactions   Quinine Derivatives Hives    HOME MEDICATIONS: Outpatient Medications Prior to Visit  Medication Sig Dispense Refill   albuterol (PROVENTIL HFA;VENTOLIN HFA) 108 (90 BASE) MCG/ACT inhaler Inhale 2 puffs into the lungs every 6 (six) hours as needed for wheezing or shortness of breath. 1 Inhaler 2   cetirizine (ZYRTEC) 10 MG tablet Take 1 tablet (10 mg total) by mouth daily. 30 tablet 0   HYDROcodone-acetaminophen (NORCO/VICODIN) 5-325 MG tablet Take 1-2 tablets by mouth every 4 (four) hours as needed. 12 tablet 0   insulin glargine (LANTUS) 100 UNIT/ML injection Inject 15 Units into the skin at bedtime. 10 mL 3   ipratropium (ATROVENT) 0.06 % nasal spray Place 2 sprays into both nostrils 4 (four) times daily. 15 mL 1   metFORMIN (GLUCOPHAGE) 1000 MG tablet Take 0.5 tablets (500 mg total) by mouth 2 (two) times daily. 30 tablet 0   naproxen (NAPROSYN) 500 MG tablet Take 1 tablet (500 mg total) by mouth 2 (two) times daily with a meal. 30 tablet 0   ondansetron (ZOFRAN) 4 MG tablet Take 1 tablet (4 mg total) by mouth every 6 (six) hours. Prn n/v. 8 tablet 0   SUMAtriptan (IMITREX) 100  MG tablet Take 1 tablet at earliest onset of headache.  May repeat in 2 hours if headache persists/recurs.  Do not exceed 2 tablets in 24 hours 10 tablet 2   tobramycin (TOBREX) 0.3 % ophthalmic solution Place 1 drop into the left eye every 4 (four) hours. 5 mL 0   topiramate (TOPAMAX) 25 MG tablet Take one tablet at night for one week, then take 2 tablets at night for one week, then take 3 tablets at night. 90 tablet 3   No facility-administered medications prior to visit.    PAST MEDICAL HISTORY: Past Medical  History:  Diagnosis Date   Diabetes mellitus without complication (HCC)    Hypertension     PAST SURGICAL HISTORY: Past Surgical History:  Procedure Laterality Date   ABDOMINAL SURGERY      FAMILY HISTORY: No family history on file.  SOCIAL HISTORY: Social History   Socioeconomic History   Marital status: Married    Spouse name: Komlan   Number of children: 3   Years of education: 7th    Highest education level: Not on file  Occupational History   Occupation: not working  Tobacco Use   Smoking status: Never   Smokeless tobacco: Never  Vaping Use   Vaping Use: Never used  Substance and Sexual Activity   Alcohol use: No   Drug use: No   Sexual activity: Yes    Birth control/protection: None  Other Topics Concern   Not on file  Social History Narrative   Lives with husband and 3 children.  Not working.  Education: 7th grade.    Right Handed   Drinks caffeine rarely   Social Determinants of Health   Financial Resource Strain: Not on file  Food Insecurity: Not on file  Transportation Needs: Not on file  Physical Activity: Not on file  Stress: Not on file  Social Connections: Not on file  Intimate Partner Violence: Not on file   PHYSICAL EXAM  Vitals:   02/12/21 0938  BP: 129/84  Pulse: 85  Weight: 176 lb (79.8 kg)  Height: 5\' 4"  (1.626 m)   Body mass index is 30.21 kg/m.  Generalized: Well developed, in no acute distress   Neurological examination  Mentation: Alert, speaks very little English, her husband translates Cranial nerve II-XII: Pupils were equal round reactive to light. Extraocular movements were full, visual field were full on confrontational test. Facial sensation and strength were normal. Head turning and shoulder shrug  were normal and symmetric. Motor: The motor testing reveals 5 over 5 strength of all 4 extremities. Good symmetric motor tone is noted throughout.  Sensory: Sensory testing is intact to soft touch on all 4 extremities. No  evidence of extinction is noted.  Coordination: Cerebellar testing reveals good finger-nose-finger and heel-to-shin bilaterally.  Gait and station: Gait is normal. Tandem gait is normal.  Reflexes: Deep tendon reflexes are symmetric and normal bilaterally.   DIAGNOSTIC DATA (LABS, IMAGING, TESTING) - I reviewed patient records, labs, notes, testing and imaging myself where available.  Lab Results  Component Value Date   WBC 8.7 09/25/2020   HGB 13.1 09/25/2020   HCT 39.1 09/25/2020   MCV 82.1 09/25/2020   PLT 198 09/25/2020      Component Value Date/Time   NA 131 (L) 09/25/2020 2250   K 4.4 09/25/2020 2250   CL 98 09/25/2020 2250   CO2 18 (L) 09/25/2020 2250   GLUCOSE 816 (HH) 09/25/2020 2250   BUN 18 09/25/2020 2250  CREATININE 1.17 (H) 09/25/2020 2250   CREATININE 0.83 05/12/2013 0929   CALCIUM 9.6 09/25/2020 2250   PROT 6.8 08/13/2016 0122   ALBUMIN 3.0 (L) 08/13/2016 0122   AST 30 08/13/2016 0122   ALT 31 08/13/2016 0122   ALKPHOS 134 (H) 08/13/2016 0122   BILITOT 1.2 08/13/2016 0122   GFRNONAA 59 (L) 09/25/2020 2250   GFRAA >60 04/27/2020 1829   Lab Results  Component Value Date   CHOL 168 05/12/2013   HDL 48 05/12/2013   LDLCALC 111 (H) 05/12/2013   TRIG 45 05/12/2013   CHOLHDL 3.5 05/12/2013   Lab Results  Component Value Date   HGBA1C 5.1 05/12/2013   No results found for: VITAMINB12 Lab Results  Component Value Date   TSH 1.589 05/12/2013   ASSESSMENT AND PLAN 45 y.o. year old female  has a past medical history of Diabetes mellitus without complication (HCC) and Hypertension. here with:  1.  Chronic daily headache 2.  History of reported right arm and leg heaviness  -Visit was rather difficult given poor historian, language barrier; they didn't want interpreter services -Will retry Topamax working up to 75 mg at bedtime, as I am not sure she has actually tried the medication; previously has been prescribed nortriptyline -Will pursue MRI of the  brain with and without contrast ordered at last visit, however she denies current right-sided weakness, still has risk factors for stroke including HTN, DM -Check creatinine before MRI -Medications and plan reviewed in detail with her husband, both she and her husband seem to understand at conclusion of visit -Call for dose adjustment, otherwise follow-up in 6 months or sooner if needed  Margie Ege, Edrick Oh, DNP 02/12/2021, 10:12 AM [facility] [address], [address] [place], [place] [address] [phone]

## 2021-02-13 ENCOUNTER — Telehealth: Payer: Self-pay

## 2021-02-13 LAB — CREATININE, SERUM
Creatinine, Ser: 1.04 mg/dL — ABNORMAL HIGH (ref 0.57–1.00)
eGFR: 67 mL/min/{1.73_m2} (ref >59)

## 2021-02-13 NOTE — Telephone Encounter (Signed)
-----   Message from Glean Salvo, NP sent at 02/13/2021  5:47 AM EDT ----- Creatinine is slightly elevated, ensure drinking plenty of water, especially before and after MRI with contrast.

## 2021-02-13 NOTE — Telephone Encounter (Signed)
Irving Burton obtained auth for this patient back in Feb, it appears the order was sent to [facility] (in-network with patient's plan) and patient was scheduled for 2/18. I am not sure why exam was not completed at that time. That auth expired in April. I called [facility] @ [phone] and spoke with Anisia to start a new request for CPT [phone]. She advised me to fax notes to  [phone]. Tracking #[id number].

## 2021-02-13 NOTE — Telephone Encounter (Signed)
Pt verified by name and DOB, results given per provider, pt voiced understanding all question answered. 

## 2021-02-15 NOTE — Telephone Encounter (Signed)
Received approval from [facility].  #[id number] (02/13/21- 04/14/21). Sent order to [facility].

## 2021-03-20 ENCOUNTER — Telehealth: Payer: Self-pay | Admitting: Neurology

## 2021-03-20 NOTE — Telephone Encounter (Signed)
I called the patient.  MRI of the brain was unremarkable.   MRI brain 03/19/21:  Impression:  1.  No acute intracranial pathology identified.  2.  Normal brain MRI.  3.  Mild chronic right maxillary sinus disease

## 2021-04-29 ENCOUNTER — Ambulatory Visit (HOSPITAL_COMMUNITY)
Admission: EM | Admit: 2021-04-29 | Discharge: 2021-04-29 | Disposition: A | Discharge status: Home / Self Care | Payer: Medicaid Other

## 2021-04-29 ENCOUNTER — Other Ambulatory Visit: Payer: Self-pay

## 2021-04-29 ENCOUNTER — Encounter (HOSPITAL_COMMUNITY): Payer: Self-pay | Admitting: *Deleted

## 2021-04-29 DIAGNOSIS — R5081 Fever presenting with conditions classified elsewhere: Secondary | ICD-10-CM | POA: Diagnosis not present

## 2021-04-29 DIAGNOSIS — G43709 Chronic migraine without aura, not intractable, without status migrainosus: Secondary | ICD-10-CM | POA: Diagnosis not present

## 2021-04-29 MED ORDER — DEXAMETHASONE SODIUM PHOSPHATE 10 MG/ML IJ SOLN
10.0000 mg | Freq: Once | INTRAMUSCULAR | Status: AC
  Administered 2021-04-29: 10 mg via INTRAMUSCULAR

## 2021-04-29 MED ORDER — KETOROLAC TROMETHAMINE 30 MG/ML IJ SOLN
30.0000 mg | Freq: Once | INTRAMUSCULAR | Status: AC
  Administered 2021-04-29: 30 mg via INTRAMUSCULAR

## 2021-04-29 MED ORDER — KETOROLAC TROMETHAMINE 30 MG/ML IJ SOLN
INTRAMUSCULAR | Status: AC
  Filled 2021-04-29: qty 1

## 2021-04-29 MED ORDER — DEXAMETHASONE SODIUM PHOSPHATE 10 MG/ML IJ SOLN
INTRAMUSCULAR | Status: AC
  Filled 2021-04-29: qty 1

## 2021-04-29 NOTE — ED Triage Notes (Signed)
C/O "high" fevers (tactile) with HA x 1 wk.  Denies any cough, congestion, n/v.  Has been taking Tyl - last dose yesterday.

## 2021-04-29 NOTE — Discharge Instructions (Addendum)
You received an injection of ketorolac, a powerful anti-inflammatory as well as Decadron which is a steroid.  This should relieve your headache symptoms completely.  Should you experience increased fever, body aches, chills, loss of taste or smell, worsening headache, shortness of breath, dry cough, sore throat, please return for COVID-19 testing.  Please follow-up with your primary care provider to discuss alternate options for migraine prevention.

## 2021-04-29 NOTE — ED Provider Notes (Signed)
[facility]    CSN: [id number] Arrival date & time: 04/29/21  1006      History   Chief Complaint Chief Complaint  Patient presents with   Fever   Headache    HPI Tanya [name] is a 45 y.o. female.   Patient presents with recurrent chronic migraine for the past week.  Patient states she has been unable to sleep, patient states she usually gets a fever when headaches last this long.  Patient denies sore throat, cough, vision changes, chest pain, shortness of breath, loss of smell or taste, anorexia, arthralgia, myalgia.  Patient states she is type II diabetic and has been taking metformin regularly.  Patient states she has been unable to sleep due to headache.  Patient is requesting an injection for her headache today.  The history is provided by the patient and the spouse. The history is limited by a language barrier. A language interpreter was used.  Fever Temp source:  Unable to specify Severity:  Mild Onset quality:  Gradual Duration:  1 week Timing:  Constant Progression:  Waxing and waning Chronicity:  Recurrent Relieved by:  None tried Worsened by:  Nothing Associated symptoms: chills and headaches   Associated symptoms: no chest pain, no confusion, no congestion, no cough, no diarrhea, no dysuria, no ear pain, no myalgias, no nausea, no rash, no rhinorrhea, no somnolence, no sore throat and no vomiting   Headache Pain location:  R temporal Quality:  Unable to specify Radiates to:  Does not radiate Severity currently:  7/10 Severity at highest:  10/10 Duration:  1 week Timing:  Constant Progression:  Unchanged Chronicity:  Recurrent Similar to prior headaches: yes   Relieved by:  None tried Worsened by:  Nothing Ineffective treatments:  None tried Associated symptoms: fatigue and fever   Associated symptoms: no abdominal pain, no blurred vision, no congestion, no cough, no diarrhea, no dizziness, no ear pain, no facial pain, no loss of balance, no  myalgias, no nausea, no neck pain, no neck stiffness, no photophobia, no sore throat, no swollen glands, no syncope, no URI, no visual change and no vomiting    Past Medical History:  Diagnosis Date   Diabetes mellitus without complication (HCC)    Hypertension     Patient Active Problem List   Diagnosis Date Noted   Chronic daily headache 02/12/2021   Newly diagnosed diabetes (HCC) 10/20/2011   DKA (diabetic ketoacidoses) 10/20/2011    History reviewed. No pertinent surgical history.   OB History     Gravida  3   Para  3   Term  3   Preterm      AB      Living  3      SAB      IAB      Ectopic      Multiple      Live Births  3            Home Medications    Prior to Admission medications   Medication Sig Start Date End Date Taking? Authorizing Provider  Insulin Glargine (BASAGLAR KWIKPEN [place]) Inject into the skin.   Yes [provider]  metFORMIN (GLUCOPHAGE) 1000 MG tablet Take 0.5 tablets (500 mg total) by mouth 2 (two) times daily. 09/26/20  Yes Lorelee New, PA-C  topiramate (TOPAMAX) 25 MG tablet Take one tablet at night for one week, then take 2 tablets at night for one week, then take 3 tablets at night. 02/12/21  Yes Glean SalvoSlack, Sarah J, NP  albuterol (PROVENTIL HFA;VENTOLIN HFA) 108 (90 BASE) MCG/ACT inhaler Inhale 2 puffs into the lungs every 6 (six) hours as needed for wheezing or shortness of breath. 07/06/14   Rodolph Bongorey, Evan S, MD  cetirizine (ZYRTEC) 10 MG tablet Take 1 tablet (10 mg total) by mouth daily. 09/26/20   Lorelee NewGreen, Garrett L, PA-C  HYDROcodone-acetaminophen (NORCO/VICODIN) 5-325 MG tablet Take 1-2 tablets by mouth every 4 (four) hours as needed. 01/23/16   Elpidio AnisUpstill, Shari, PA-C  insulin glargine (LANTUS) 100 UNIT/ML injection Inject 15 Units into the skin at bedtime. 10/21/11 04/29/21  Regalado, Belkys A, MD  ipratropium (ATROVENT) 0.06 % nasal spray Place 2 sprays into both nostrils 4 (four) times daily. 07/06/14   Rodolph Bongorey, Evan S, MD   naproxen (NAPROSYN) 500 MG tablet Take 1 tablet (500 mg total) by mouth 2 (two) times daily with a meal. 03/29/20   Wallis BambergMani, Mario, PA-C  ondansetron (ZOFRAN) 4 MG tablet Take 1 tablet (4 mg total) by mouth every 6 (six) hours. Prn n/v. 08/09/16   Linna HoffKindl, James D, MD  SUMAtriptan (IMITREX) 100 MG tablet Take 1 tablet at earliest onset of headache.  May repeat in 2 hours if headache persists/recurs.  Do not exceed 2 tablets in 24 hours 11/12/16   Shon MilletJaffe, Adam R, DO  tobramycin (TOBREX) 0.3 % ophthalmic solution Place 1 drop into the left eye every 4 (four) hours. 03/29/20   Wallis BambergMani, Mario, PA-C  nortriptyline (PAMELOR) 25 MG capsule Take 1 capsule (25 mg total) by mouth at bedtime. 11/12/16 03/29/20  Drema DallasJaffe, Adam R, DO    Family History History reviewed. No pertinent family history.  Social History Social History   Tobacco Use   Smoking status: Never   Smokeless tobacco: Never  Vaping Use   Vaping Use: Never used  Substance Use Topics   Alcohol use: No   Drug use: No     Allergies   Quinine derivatives   Review of Systems Review of Systems  Constitutional:  Positive for chills, fatigue and fever.  HENT:  Negative for congestion, ear pain, rhinorrhea and sore throat.   Eyes:  Negative for blurred vision and photophobia.  Respiratory:  Negative for cough.   Cardiovascular:  Negative for chest pain and syncope.  Gastrointestinal:  Negative for abdominal pain, diarrhea, nausea and vomiting.  Genitourinary:  Negative for dysuria.  Musculoskeletal:  Negative for myalgias, neck pain and neck stiffness.  Skin:  Negative for rash.  Neurological:  Positive for headaches. Negative for dizziness and loss of balance.  Psychiatric/Behavioral:  Negative for confusion.   All other systems reviewed and are negative.   Physical Exam Triage Vital Signs ED Triage Vitals  Enc Vitals Group     BP      Pulse      Resp      Temp      Temp src      SpO2      Weight      Height      Head  Circumference      Peak Flow      Pain Score      Pain Loc      Pain Edu?      Excl. in GC?    No data found.  Updated Vital Signs BP 128/84   Pulse (!) 107   Temp (!) 100.7 F (38.2 C) (Oral)   Resp 20   LMP 07/30/2016   SpO2 100%   Visual Acuity Right  Eye Distance:   Left Eye Distance:   Bilateral Distance:    Right Eye Near:   Left Eye Near:    Bilateral Near:     Physical Exam Constitutional:      Appearance: Normal appearance.  HENT:     Head: Normocephalic and atraumatic.     Right Ear: Tympanic membrane, ear canal and external ear normal.     Left Ear: Tympanic membrane, ear canal and external ear normal.     Nose: Nose normal.  Eyes:     Extraocular Movements: Extraocular movements intact.     Pupils: Pupils are equal, round, and reactive to light.  Cardiovascular:     Rate and Rhythm: Regular rhythm. Tachycardia present.     Pulses: Normal pulses.     Heart sounds: Normal heart sounds. No murmur heard.   No friction rub. No gallop.  Pulmonary:     Effort: Pulmonary effort is normal. No respiratory distress.     Breath sounds: Normal breath sounds. No wheezing.  Abdominal:     General: Abdomen is flat. Bowel sounds are normal.     Palpations: Abdomen is soft.  Musculoskeletal:        General: Normal range of motion.     Cervical back: Normal range of motion and neck supple. No rigidity or tenderness.  Lymphadenopathy:     Cervical: No cervical adenopathy.  Skin:    General: Skin is warm and dry.  Neurological:     General: No focal deficit present.     Mental Status: She is alert and oriented to person, place, and time.  Psychiatric:        Mood and Affect: Mood normal.        Behavior: Behavior normal.     UC Treatments / Results  Labs (all labs ordered are listed, but only abnormal results are displayed) Labs Reviewed - No data to display  EKG   Radiology No results found.  Procedures Procedures (including critical care  time)  Medications Ordered in UC Medications  ketorolac (TORADOL) 30 MG/ML injection 30 mg (has no administration in time range)  dexamethasone (DECADRON) injection 10 mg (has no administration in time range)    Initial Impression / Assessment and Plan / UC Course  I have reviewed the triage vital signs and the nursing notes.  Pertinent labs & imaging results that were available during my care of the patient were reviewed by me and considered in my medical decision making (see chart for details).     Patient reports long history of recurrent migraines.  Patient states she has not tried any medication prior to this visit other than taking her topiramate which was prescribed by her primary care physician.  Patient states she frequently spikes a fever with migraines if they go on for more than a few days.  Temp today is 100.7, patient reports chills at night despite wearing multiple layers of clothing.  Based on physical exam there is no obvious infectious cause for fever.  Will provide patient with migraine abortive treatment with ketorolac and Decadron.  Patient and spouse given strict return precautions for worsening fever, new onset body aches, sore throat, sinus pain, worsening headache, loss of sense of taste or smell, stiffness or pain in her neck.  Patient and spouse verbalized understanding.  All questions were addressed. Final Clinical Impressions(s) / UC Diagnoses   Final diagnoses:  Fever in other diseases  Chronic migraine without aura without status migrainosus, not intractable  Discharge Instructions      You received an injection of ketorolac, a powerful anti-inflammatory as well as Decadron which is a steroid.  This should relieve your headache symptoms completely.  Should you experience increased fever, body aches, chills, loss of taste or smell, worsening headache, shortness of breath, dry cough, sore throat, please return for COVID-19 testing.  Please follow-up with  your primary care provider to discuss alternate options for migraine prevention.     ED Prescriptions   None    PDMP not reviewed this encounter.   Theadora Rama Scales, PA-C 04/29/21 1050

## 2021-05-03 ENCOUNTER — Encounter (HOSPITAL_COMMUNITY): Payer: Self-pay | Admitting: Emergency Medicine

## 2021-05-03 ENCOUNTER — Other Ambulatory Visit: Payer: Self-pay

## 2021-05-03 ENCOUNTER — Emergency Department (HOSPITAL_COMMUNITY)
Admission: EM | Admit: 2021-05-03 | Discharge: 2021-05-04 | Disposition: A | Discharge status: Home / Self Care | Payer: Medicaid Other | Attending: Emergency Medicine | Admitting: Emergency Medicine

## 2021-05-03 DIAGNOSIS — I10 Essential (primary) hypertension: Secondary | ICD-10-CM | POA: Diagnosis not present

## 2021-05-03 DIAGNOSIS — B349 Viral infection, unspecified: Secondary | ICD-10-CM | POA: Insufficient documentation

## 2021-05-03 DIAGNOSIS — Z20822 Contact with and (suspected) exposure to covid-19: Secondary | ICD-10-CM | POA: Insufficient documentation

## 2021-05-03 DIAGNOSIS — Z794 Long term (current) use of insulin: Secondary | ICD-10-CM | POA: Diagnosis not present

## 2021-05-03 DIAGNOSIS — Z79899 Other long term (current) drug therapy: Secondary | ICD-10-CM | POA: Insufficient documentation

## 2021-05-03 DIAGNOSIS — E1110 Type 2 diabetes mellitus with ketoacidosis without coma: Secondary | ICD-10-CM | POA: Insufficient documentation

## 2021-05-03 DIAGNOSIS — Z7984 Long term (current) use of oral hypoglycemic drugs: Secondary | ICD-10-CM | POA: Diagnosis not present

## 2021-05-03 DIAGNOSIS — R509 Fever, unspecified: Secondary | ICD-10-CM | POA: Diagnosis present

## 2021-05-03 LAB — CBC WITH DIFFERENTIAL/PLATELET
Abs Immature Granulocytes: 0.02 10*3/uL (ref 0.00–0.07)
Basophils Absolute: 0 10*3/uL (ref 0.0–0.1)
Basophils Relative: 1 %
Eosinophils Absolute: 0.1 10*3/uL (ref 0.0–0.5)
Eosinophils Relative: 2 %
HCT: 39.3 % (ref 36.0–46.0)
Hemoglobin: 12.5 g/dL (ref 12.0–15.0)
Immature Granulocytes: 0 %
Lymphocytes Relative: 45 %
Lymphs Abs: 2.8 10*3/uL (ref 0.7–4.0)
MCH: 27.4 pg (ref 26.0–34.0)
MCHC: 31.8 g/dL (ref 30.0–36.0)
MCV: 86.2 fL (ref 80.0–100.0)
Monocytes Absolute: 0.4 10*3/uL (ref 0.1–1.0)
Monocytes Relative: 7 %
Neutro Abs: 2.8 10*3/uL (ref 1.7–7.7)
Neutrophils Relative %: 45 %
Platelets: 269 10*3/uL (ref 150–400)
RBC: 4.56 MIL/uL (ref 3.87–5.11)
RDW: 13.2 % (ref 11.5–15.5)
WBC: 6.2 10*3/uL (ref 4.0–10.5)
nRBC: 0 % (ref 0.0–0.2)

## 2021-05-03 LAB — RESP PANEL BY RT-PCR (FLU A&B, COVID) ARPGX2
Influenza A by PCR: NEGATIVE
Influenza B by PCR: NEGATIVE
SARS Coronavirus 2 by RT PCR: NEGATIVE

## 2021-05-03 LAB — COMPREHENSIVE METABOLIC PANEL
ALT: 14 U/L (ref 0–44)
AST: 13 U/L — ABNORMAL LOW (ref 15–41)
Albumin: 3.2 g/dL — ABNORMAL LOW (ref 3.5–5.0)
Alkaline Phosphatase: 76 U/L (ref 38–126)
Anion gap: 8 (ref 5–15)
BUN: 9 mg/dL (ref 6–20)
CO2: 23 mmol/L (ref 22–32)
Calcium: 9.7 mg/dL (ref 8.9–10.3)
Chloride: 107 mmol/L (ref 98–111)
Creatinine, Ser: 0.93 mg/dL (ref 0.44–1.00)
GFR, Estimated: >60 mL/min (ref >60)
Glucose, Bld: 195 mg/dL — ABNORMAL HIGH (ref 70–99)
Potassium: 3.7 mmol/L (ref 3.5–5.1)
Sodium: 138 mmol/L (ref 135–145)
Total Bilirubin: 1.7 mg/dL — ABNORMAL HIGH (ref 0.3–1.2)
Total Protein: 6.9 g/dL (ref 6.5–8.1)

## 2021-05-03 LAB — GROUP A STREP BY PCR: Group A Strep by PCR: NOT DETECTED

## 2021-05-03 LAB — LIPASE, BLOOD: Lipase: 27 U/L (ref 11–51)

## 2021-05-03 MED ORDER — ACETAMINOPHEN 500 MG PO TABS
1000.0000 mg | ORAL_TABLET | Freq: Once | ORAL | Status: AC
  Administered 2021-05-03: 1000 mg via ORAL
  Filled 2021-05-03: qty 2

## 2021-05-03 MED ORDER — LACTATED RINGERS IV BOLUS
2000.0000 mL | Freq: Once | INTRAVENOUS | Status: AC
  Administered 2021-05-03: 2000 mL via INTRAVENOUS

## 2021-05-03 MED ORDER — DEXAMETHASONE 4 MG PO TABS
10.0000 mg | ORAL_TABLET | Freq: Once | ORAL | Status: AC
  Administered 2021-05-03: 10 mg via ORAL
  Filled 2021-05-03: qty 3

## 2021-05-03 MED ORDER — KETOROLAC TROMETHAMINE 15 MG/ML IJ SOLN
15.0000 mg | Freq: Once | INTRAMUSCULAR | Status: AC
  Administered 2021-05-03: 15 mg via INTRAVENOUS
  Filled 2021-05-03: qty 1

## 2021-05-03 NOTE — ED Triage Notes (Signed)
Pt has had a fever, loss of appetite, lethargy for 3 days. Denies headache, cough.

## 2021-05-03 NOTE — ED Provider Notes (Signed)
Emergency Medicine Provider Triage Evaluation Note  Tanya [name] , a 45 y.o. female  was evaluated in triage.  Pt complains of fatigue, loss of appetite, sore throat, fever, abd pain  Review of Systems  Positive:  fatigue, loss of appetite, sore throat, fever, abd pain Negative: urinary  Physical Exam  BP 123/88 (BP Location: Left Arm)   Pulse 99   Temp 98.8 F (37.1 C) (Oral)   Resp 14   LMP 07/30/2016   SpO2 100%  Gen:   Awake, no distress   Resp:  Normal effort  MSK:   Moves extremities without difficulty   Medical Decision Making  Medically screening exam initiated at 12:30 PM.  Appropriate orders placed.  [name] was informed that the remainder of the evaluation will be completed by another provider, this initial triage assessment does not replace that evaluation, and the importance of remaining in the ED until their evaluation is complete.     Karrie Meres, PA-C 05/03/21 1232    Rozelle Logan, DO 05/03/21 1328

## 2021-05-03 NOTE — ED Provider Notes (Signed)
[facility] EMERGENCY DEPARTMENT Provider Note   CSN: [id number] Arrival date & time: 05/03/21  1200     History Chief Complaint  Patient presents with   Fever    Tanya [name] is a 45 y.o. female with PMHx HTN, DM, migraines who presents for evaluation of flulike symptoms.  Patient reports an approximately 1 week history of generalized weakness, myalgias, and malaise.  She states that over this time she has experienced significant sore throat and decreased appetite.  She has also had some mild epigastric pain over this time as well.  No rhinorrhea/congestion, cough, chest pain, shortness of breath, nausea, vomiting, diarrhea, constipation, or urinary symptoms.  She states that she experienced fevers on the first day but has not had fever since then.  She denies any recent travel or known sick contacts.  They have not tried any medications or interventions to help with her symptoms.  They subsequently presented to our emergency department for further evaluation.     Past Medical History:  Diagnosis Date   Diabetes mellitus without complication (HCC)    Hypertension     Patient Active Problem List   Diagnosis Date Noted   Chronic daily headache 02/12/2021   Newly diagnosed diabetes (HCC) 10/20/2011   DKA (diabetic ketoacidoses) 10/20/2011    History reviewed. No pertinent surgical history.   OB History     Gravida  3   Para  3   Term  3   Preterm      AB      Living  3      SAB      IAB      Ectopic      Multiple      Live Births  3           No family history on file.  Social History   Tobacco Use   Smoking status: Never   Smokeless tobacco: Never  Vaping Use   Vaping Use: Never used  Substance Use Topics   Alcohol use: No   Drug use: No    Home Medications Prior to Admission medications   Medication Sig Start Date End Date Taking? Authorizing Provider  albuterol (PROVENTIL HFA;VENTOLIN HFA) 108 (90 BASE) MCG/ACT  inhaler Inhale 2 puffs into the lungs every 6 (six) hours as needed for wheezing or shortness of breath. 07/06/14   Rodolph Bong, MD  cetirizine (ZYRTEC) 10 MG tablet Take 1 tablet (10 mg total) by mouth daily. 09/26/20   Lorelee New, PA-C  HYDROcodone-acetaminophen (NORCO/VICODIN) 5-325 MG tablet Take 1-2 tablets by mouth every 4 (four) hours as needed. 01/23/16   Elpidio Anis, PA-C  Insulin Glargine (BASAGLAR KWIKPEN [place]) Inject into the skin.    [provider]  insulin glargine (LANTUS) 100 UNIT/ML injection Inject 15 Units into the skin at bedtime. 10/21/11 04/29/21  Regalado, Belkys A, MD  ipratropium (ATROVENT) 0.06 % nasal spray Place 2 sprays into both nostrils 4 (four) times daily. 07/06/14   Rodolph Bong, MD  metFORMIN (GLUCOPHAGE) 1000 MG tablet Take 0.5 tablets (500 mg total) by mouth 2 (two) times daily. 09/26/20   Lorelee New, PA-C  naproxen (NAPROSYN) 500 MG tablet Take 1 tablet (500 mg total) by mouth 2 (two) times daily with a meal. 03/29/20   Wallis Bamberg, PA-C  ondansetron (ZOFRAN) 4 MG tablet Take 1 tablet (4 mg total) by mouth every 6 (six) hours. Prn n/v. 08/09/16   Linna Hoff, MD  SUMAtriptan (  IMITREX) 100 MG tablet Take 1 tablet at earliest onset of headache.  May repeat in 2 hours if headache persists/recurs.  Do not exceed 2 tablets in 24 hours 11/12/16   Shon Millet R, DO  tobramycin (TOBREX) 0.3 % ophthalmic solution Place 1 drop into the left eye every 4 (four) hours. 03/29/20   Wallis Bamberg, PA-C  topiramate (TOPAMAX) 25 MG tablet Take one tablet at night for one week, then take 2 tablets at night for one week, then take 3 tablets at night. 02/12/21   Glean Salvo, NP  nortriptyline (PAMELOR) 25 MG capsule Take 1 capsule (25 mg total) by mouth at bedtime. 11/12/16 03/29/20  Drema Dallas, DO    Allergies    Quinine derivatives  Review of Systems   Review of Systems  Constitutional:  Positive for activity change, appetite change, fatigue and fever.  HENT:   Positive for sore throat. Negative for ear pain.   Eyes:  Negative for pain and visual disturbance.  Respiratory:  Negative for cough and shortness of breath.   Cardiovascular:  Negative for chest pain and palpitations.  Gastrointestinal:  Positive for abdominal pain. Negative for constipation, diarrhea and vomiting.  Genitourinary:  Negative for dysuria and hematuria.  Musculoskeletal:  Positive for myalgias. Negative for arthralgias and back pain.  Skin:  Negative for color change and rash.  Neurological:  Negative for seizures and syncope.  All other systems reviewed and are negative.  Physical Exam Updated Vital Signs BP 114/79   Pulse 74   Temp 98.8 F (37.1 C) (Oral)   Resp 16   LMP 07/30/2016   SpO2 100%   Physical Exam Vitals and nursing note reviewed.  Constitutional:      General: She is not in acute distress.    Appearance: Normal appearance. She is well-developed and normal weight. She is not ill-appearing.  HENT:     Head: Normocephalic and atraumatic.     Mouth/Throat:     Mouth: Mucous membranes are dry.  Eyes:     Conjunctiva/sclera: Conjunctivae normal.  Cardiovascular:     Rate and Rhythm: Normal rate and regular rhythm.     Heart sounds: No murmur heard. Pulmonary:     Effort: Pulmonary effort is normal. No respiratory distress.     Breath sounds: Normal breath sounds.  Abdominal:     General: Abdomen is flat. There is no distension.     Palpations: Abdomen is soft.     Tenderness: There is no abdominal tenderness.     Comments: Abdomen is soft, nontender, and nondistended throughout.  No rebound, guarding, peritoneal signs.  Musculoskeletal:     Cervical back: Neck supple.  Skin:    General: Skin is warm and dry.     Capillary Refill: Capillary refill takes less than 2 seconds.  Neurological:     General: No focal deficit present.     Mental Status: She is alert and oriented to person, place, and time. Mental status is at baseline.     Cranial  Nerves: No cranial nerve deficit.     Sensory: No sensory deficit.     Motor: No weakness.    ED Results / Procedures / Treatments   Labs (all labs ordered are listed, but only abnormal results are displayed) Labs Reviewed  COMPREHENSIVE METABOLIC PANEL - Abnormal; Notable for the following components:      Result Value   Glucose, Bld 195 (*)    Albumin 3.2 (*)    AST  13 (*)    Total Bilirubin 1.7 (*)    All other components within normal limits  RESP PANEL BY RT-PCR (FLU A&B, COVID) ARPGX2  GROUP A STREP BY PCR  CBC WITH DIFFERENTIAL/PLATELET  LIPASE, BLOOD   EKG None  Radiology No results found.  Procedures Procedures   Medications Ordered in ED Medications  lactated ringers bolus 2,000 mL (0 mLs Intravenous Stopped 05/04/21 0041)  acetaminophen (TYLENOL) tablet 1,000 mg (1,000 mg Oral Given 05/03/21 2327)  ketorolac (TORADOL) 15 MG/ML injection 15 mg (15 mg Intravenous Given 05/03/21 2234)  dexamethasone (DECADRON) tablet 10 mg (10 mg Oral Given 05/03/21 2327)    ED Course  I have reviewed the triage vital signs and the nursing notes.  Pertinent labs & imaging results that were available during my care of the patient were reviewed by me and considered in my medical decision making (see chart for details).    MDM Rules/Calculators/A&P                           45 y.o. female with past medical history as above who presents for evaluation of flulike symptoms. Afebrile and hemodynamically stable.  Exam as detailed above.  Presentation appears consistent with a viral syndrome given generalized malaise, myalgias, sore throat, and decreased appetite.  She notes experiencing fever 3 days ago, but none since then.  She reports experiencing some mild epigastric pain over this time as well, but she is not tender on my exam.  I do not believe that CT imaging is warranted at this time.  No cough, shortness of breath, or chest pain to warrant chest x-ray.  Labs were obtained.  No  leukocytosis or left shift.  CMP is remarkable only for a very mild hyperbilirubinemia.  Lipase is normal.  COVID and influenza testing positive.  Strep testing negative.  Patient was treated symptomatically with Tylenol, Toradol, and lactated Ringer's, with significant improvement.  Patient is feeling much better.  She is also given a single dose of Decadron due to significant sore throat.  No evidence of RPA, PTA, or Lemierre syndrome.  Patient will follow-up closely with PCP.  Final Clinical Impression(s) / ED Diagnoses Final diagnoses:  Viral illness    Rx / DC Orders ED Discharge Orders     None        Holley Dexter, MD 05/04/21 1402    Gerhard Munch, MD 05/07/21 [phone]

## 2021-06-11 ENCOUNTER — Emergency Department (HOSPITAL_COMMUNITY)
Admission: EM | Admit: 2021-06-11 | Discharge: 2021-06-12 | Disposition: A | Discharge status: Home / Self Care | Payer: Medicaid Other | Attending: Emergency Medicine | Admitting: Emergency Medicine

## 2021-06-11 ENCOUNTER — Emergency Department (HOSPITAL_COMMUNITY): Payer: Medicaid Other

## 2021-06-11 DIAGNOSIS — Y92410 Unspecified street and highway as the place of occurrence of the external cause: Secondary | ICD-10-CM | POA: Diagnosis not present

## 2021-06-11 DIAGNOSIS — I10 Essential (primary) hypertension: Secondary | ICD-10-CM | POA: Insufficient documentation

## 2021-06-11 DIAGNOSIS — Z794 Long term (current) use of insulin: Secondary | ICD-10-CM | POA: Diagnosis not present

## 2021-06-11 DIAGNOSIS — E119 Type 2 diabetes mellitus without complications: Secondary | ICD-10-CM | POA: Insufficient documentation

## 2021-06-11 DIAGNOSIS — S20212A Contusion of left front wall of thorax, initial encounter: Secondary | ICD-10-CM | POA: Insufficient documentation

## 2021-06-11 DIAGNOSIS — Z7984 Long term (current) use of oral hypoglycemic drugs: Secondary | ICD-10-CM | POA: Diagnosis not present

## 2021-06-11 DIAGNOSIS — S299XXA Unspecified injury of thorax, initial encounter: Secondary | ICD-10-CM | POA: Diagnosis present
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
## 2021-06-12 MED ORDER — KETOROLAC TROMETHAMINE 60 MG/2ML IM SOLN
30.0000 mg | Freq: Once | INTRAMUSCULAR | Status: AC
  Administered 2021-06-12: 30 mg via INTRAMUSCULAR
  Filled 2021-06-12: qty 2

## 2021-06-12 MED ORDER — CYCLOBENZAPRINE HCL 10 MG PO TABS: 10.0000 mg | ORAL_TABLET | Freq: Two times a day (BID) | ORAL | 0 refills | Status: DC | PRN
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
## 2021-06-12 NOTE — Discharge Instructions (Signed)
Recommend taking 1000 mg of Tylenol every 6 hours as needed for pain, recommend taking 400 mg ibuprofen every 8 hours as needed for pain.  Take muscle relaxant as prescribed, this can make you sleepy so do not use while doing dangerous activities including driving.  Follow-up with your primary care doctor.  Overall suspect that you have a bone bruise.

## 2021-08-14 ENCOUNTER — Ambulatory Visit: Payer: Medicaid Other | Admitting: Neurology

## 2021-08-14 ENCOUNTER — Encounter: Payer: Self-pay | Admitting: Neurology

## 2021-08-14 VITALS — BP 128/75 | HR 85 | Ht 64.0 in | Wt 176.0 lb

## 2021-08-14 DIAGNOSIS — R519 Headache, unspecified: Secondary | ICD-10-CM | POA: Diagnosis not present

## 2021-08-14 MED ORDER — TOPIRAMATE 50 MG PO TABS: 50.0000 mg | ORAL_TABLET | Freq: Every day | ORAL | 3 refills | Status: DC

## 2021-08-14 NOTE — Patient Instructions (Signed)
Great to see you today  Continue Topamax at current dose  Call for any worsening in headache  See you back in 1 year

## 2021-08-14 NOTE — Progress Notes (Signed)
PATIENT: Tanya [name] DOB: 1976/02/23  REASON FOR VISIT: follow up HISTORY FROM: patient and her husband Primary Neurologist: Dr. Anne Hahn, will now be followed by Dr. Teresa Coombs  Today 08/14/21 Tanya [name] here today for follow-up for headaches. MRI of the brain was unremarkable in Aug 2022. Here today with her husband. He translates, taking Topamax 50 mg at bedtime. Tolerating well. No longer having the bad headaches. Now, triggered by lack of sleep. Her husband has noted a big improvement. They didn't want interpreter services. 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 No other issues. Denies weakness on the right side.Tried a lot of medicine since she was 14 for headaches.  Update 02/12/2021 SS: Tanya [name] is a 45 year old female with history of daily headaches on the left side. Every 3rd day the headache is significant. Is very poor historian. Here with husband. [place] interpreter arrived, they turned away. Husband translates. Topamax sent last visit I think she may have taken, but then her diabetes worsened, so she stopped, but am not clear, as she doesn't know her medications. She works as a Product/process development scientist, headaches are not bad enough to keep her in the bed.  Denies any continued complaint of right-sided weakness.  No changes to her overall health.  Here today accompanied by her husband.  MRI of the brain was never done.  HISTORY 09/18/2020 Dr. Anne Hahn: Tanya [name] is a 45 year old right-handed black female from [place] with a history of essentially daily headaches since age 69.  The patient comes in with her husband today who acts as her interpreter but unfortunately he does not have a high level of English speaking skills.  Apparently, the patient was seen previously by Dr. Everlena Cooper in 2018.  She was placed on nortriptyline but never followed up afterwards.  The patient herself does not recall whether the medication was tolerated or whether it helped.  They  have been given a prescription for Imitrex that she is taking frequently for her headache.  The headache is generally in the left frontotemporal area, never anywhere else, with spread into the left ear area and sometimes associated with left neck pain.  The patient denies any nausea or vomiting but she does have photophobia and phonophobia.  She may have frequent tearing of the left eye.  She reports a heavy sensation on the right arm and right leg that has been present for 1 year.  She denies any dizziness or loss of vision.  Both parents had no headache but the patient has 1 brother and 5 sisters, and all her siblings have headache.  She is sent here for further evaluation.   REVIEW OF SYSTEMS: Out of a complete 14 system review of symptoms, the patient complains only of the following symptoms, and all other reviewed systems are negative.  headache  ALLERGIES: Allergies  Allergen Reactions   Quinine Derivatives Hives    HOME MEDICATIONS: Outpatient Medications Prior to Visit  Medication Sig Dispense Refill   metFORMIN (GLUCOPHAGE) 1000 MG tablet Take 0.5 tablets (500 mg total) by mouth 2 (two) times daily. 30 tablet 0   SUMAtriptan (IMITREX) 100 MG tablet Take 1 tablet at earliest onset of headache.  May repeat in 2 hours if headache persists/recurs.  Do not exceed 2 tablets in 24 hours 10 tablet 2   tobramycin (TOBREX) 0.3 % ophthalmic solution Place 1 drop into the left eye every 4 (four) hours. 5 mL 0   topiramate (  TOPAMAX) 25 MG tablet Take one tablet at night for one week, then take 2 tablets at night for one week, then take 3 tablets at night. 90 tablet 6   albuterol (PROVENTIL HFA;VENTOLIN HFA) 108 (90 BASE) MCG/ACT inhaler Inhale 2 puffs into the lungs every 6 (six) hours as needed for wheezing or shortness of breath. 1 Inhaler 2   cetirizine (ZYRTEC) 10 MG tablet Take 1 tablet (10 mg total) by mouth daily. 30 tablet 0   cyclobenzaprine (FLEXERIL) 10 MG tablet Take 1 tablet (10 mg  total) by mouth 2 (two) times daily as needed for up to 20 doses for muscle spasms. 20 tablet 0   HYDROcodone-acetaminophen (NORCO/VICODIN) 5-325 MG tablet Take 1-2 tablets by mouth every 4 (four) hours as needed. 12 tablet 0   Insulin Glargine (BASAGLAR KWIKPEN [place]) Inject into the skin.     insulin glargine (LANTUS) 100 UNIT/ML injection Inject 15 Units into the skin at bedtime. 10 mL 3   ipratropium (ATROVENT) 0.06 % nasal spray Place 2 sprays into both nostrils 4 (four) times daily. 15 mL 1   naproxen (NAPROSYN) 500 MG tablet Take 1 tablet (500 mg total) by mouth 2 (two) times daily with a meal. 30 tablet 0   ondansetron (ZOFRAN) 4 MG tablet Take 1 tablet (4 mg total) by mouth every 6 (six) hours. Prn n/v. 8 tablet 0   No facility-administered medications prior to visit.    PAST MEDICAL HISTORY: Past Medical History:  Diagnosis Date   Diabetes mellitus without complication (HCC)    Hypertension     PAST SURGICAL HISTORY: No past surgical history on file.   FAMILY HISTORY: No family history on file.  SOCIAL HISTORY: Social History   Socioeconomic History   Marital status: Married    Spouse name: Komlan   Number of children: 3   Years of education: 7th    Highest education level: Not on file  Occupational History   Occupation: not working  Tobacco Use   Smoking status: Never   Smokeless tobacco: Never  Vaping Use   Vaping Use: Never used  Substance and Sexual Activity   Alcohol use: No   Drug use: No   Sexual activity: Yes    Birth control/protection: None  Other Topics Concern   Not on file  Social History Narrative   Lives with husband and 3 children.  Not working.  Education: 7th grade.    Right Handed   Drinks caffeine rarely   Social Determinants of Health   Financial Resource Strain: Not on file  Food Insecurity: Not on file  Transportation Needs: Not on file  Physical Activity: Not on file  Stress: Not on file  Social Connections: Not on file   Intimate Partner Violence: Not on file   PHYSICAL EXAM  Vitals:   08/14/21 0912  BP: 128/75  Pulse: 85  Weight: 176 lb (79.8 kg)  Height: 5\' 4"  (1.626 m)    Body mass index is 30.21 kg/m.  Generalized: Well developed, in no acute distress   Neurological examination  Mentation: Alert, husband translates mostly, she speaks limited english, she is smiling, participatory Cranial nerve II-XII: Pupils were equal round reactive to light. Extraocular movements were full, visual field were full on confrontational test. Facial sensation and strength were normal. Head turning and shoulder shrug  were normal and symmetric. Motor: The motor testing reveals 5 over 5 strength of all 4 extremities. Good symmetric motor tone is noted throughout.  Sensory: Sensory testing  is intact to soft touch on all 4 extremities. No evidence of extinction is noted.  Coordination: Cerebellar testing reveals good finger-nose-finger and heel-to-shin bilaterally.  Gait and station: Gait is normal. Tandem gait is normal.  Reflexes: Deep tendon reflexes are symmetric and normal bilaterally.   DIAGNOSTIC DATA (LABS, IMAGING, TESTING) - I reviewed patient records, labs, notes, testing and imaging myself where available.  Lab Results  Component Value Date   WBC 6.2 05/03/2021   HGB 12.5 05/03/2021   HCT 39.3 05/03/2021   MCV 86.2 05/03/2021   PLT 269 05/03/2021      Component Value Date/Time   NA 138 05/03/2021 1338   K 3.7 05/03/2021 1338   CL 107 05/03/2021 1338   CO2 23 05/03/2021 1338   GLUCOSE 195 (H) 05/03/2021 1338   BUN 9 05/03/2021 1338   CREATININE 0.93 05/03/2021 1338   CREATININE 0.83 05/12/2013 0929   CALCIUM 9.7 05/03/2021 1338   PROT 6.9 05/03/2021 1338   ALBUMIN 3.2 (L) 05/03/2021 1338   AST 13 (L) 05/03/2021 1338   ALT 14 05/03/2021 1338   ALKPHOS 76 05/03/2021 1338   BILITOT 1.7 (H) 05/03/2021 1338   GFRNONAA >60 05/03/2021 1338   GFRAA >60 04/27/2020 1829   Lab Results   Component Value Date   CHOL 168 05/12/2013   HDL 48 05/12/2013   LDLCALC 111 (H) 05/12/2013   TRIG 45 05/12/2013   CHOLHDL 3.5 05/12/2013   Lab Results  Component Value Date   HGBA1C 5.1 05/12/2013   No results found for: [id number] Lab Results  Component Value Date   TSH 1.589 05/12/2013   ASSESSMENT AND PLAN 45 y.o. year old female  has a past medical history of Diabetes mellitus without complication (HCC) and Hypertension. here with:  1.  Chronic daily headache 2.  History of reported right arm and leg heaviness (resolved)  -She is doing very well with Topamax, will continue Topamax 50 mg at bedtime for headache prevention -MRI of the brain from August 2022 was unremarkable -Call for increase in headache, otherwise we will follow-up in 1 year or sooner if needed, if she continues to do well we will try to taper off Topamax, have her follow-up on an as-needed basis, since Dr. Anne Hahn has retired she will be followed by Dr. Levonne Lapping, AGNP-C, DNP 08/14/2021, 9:34 AM [facility] [address], [address] [place], [place] [address] [phone]

## 2021-11-02 ENCOUNTER — Other Ambulatory Visit: Payer: Self-pay | Admitting: Ophthalmology

## 2021-11-02 DIAGNOSIS — H5319 Other subjective visual disturbances: Secondary | ICD-10-CM

## 2021-11-22 ENCOUNTER — Other Ambulatory Visit: Payer: Medicaid Other

## 2021-11-23 ENCOUNTER — Inpatient Hospital Stay: Admission: RE | Admit: 2021-11-23 | Payer: Medicaid Other | Source: Ambulatory Visit

## 2021-11-23 ENCOUNTER — Other Ambulatory Visit: Payer: Medicaid Other

## 2021-12-07 ENCOUNTER — Ambulatory Visit
Admission: RE | Admit: 2021-12-07 | Discharge: 2021-12-07 | Disposition: A | Discharge status: Home / Self Care | Payer: Medicaid Other | Source: Ambulatory Visit | Attending: Ophthalmology | Admitting: Ophthalmology

## 2021-12-07 DIAGNOSIS — H5319 Other subjective visual disturbances: Secondary | ICD-10-CM

## 2021-12-07 MED ORDER — GADOBENATE DIMEGLUMINE 529 MG/ML IV SOLN
15.0000 mL | Freq: Once | INTRAVENOUS | Status: AC | PRN
  Administered 2021-12-07: 15 mL via INTRAVENOUS

## 2022-01-03 NOTE — Progress Notes (Deleted)
PATIENT: Tanya [name] DOB: 08-25-1975  REASON FOR VISIT: follow up HISTORY FROM: patient and her husband Primary Neurologist: Dr. Anne HahnWillis, will now be followed by Dr. Teresa Coombsamara  Today 01/07/22 Tanya [name] here today for follow-up. 12/07/21 normall MRI brain and orbits, mildly motion degraded.    Update 08/14/2021 SS: Tanya [name] here today for follow-up for headaches. MRI of the brain was unremarkable in Aug 2022. Here today with her husband. He translates, taking Topamax 50 mg at bedtime. Tolerating well. No longer having the bad headaches. Now, triggered by lack of sleep. Her husband has noted a big improvement. They didn't want interpreter services. 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 No other issues. Denies weakness on the right side.Tried a lot of medicine since she was 14 for headaches.  Update 02/12/2021 SS: Tanya [name] is a 46 year old female with history of daily headaches on the left side. Every 3rd day the headache is significant. Is very poor historian. Here with husband. [place] interpreter arrived, they turned away. Husband translates. Topamax sent last visit I think she may have taken, but then her diabetes worsened, so she stopped, but am not clear, as she doesn't know her medications. She works as a Product/process development scientistdress maker, headaches are not bad enough to keep her in the bed.  Denies any continued complaint of right-sided weakness.  No changes to her overall health.  Here today accompanied by her husband.  MRI of the brain was never done.  HISTORY 09/18/2020 Dr. Anne HahnWillis: Tanya [name] is a 46 year old right-handed black female from [place] with a history of essentially daily headaches since age 46.  The patient comes in with her husband today who acts as her interpreter but unfortunately he does not have a high level of English speaking skills.  Apparently, the patient was seen previously by Dr. Everlena CooperJaffe in 2018.  She was placed on nortriptyline but never  followed up afterwards.  The patient herself does not recall whether the medication was tolerated or whether it helped.  They have been given a prescription for Imitrex that she is taking frequently for her headache.  The headache is generally in the left frontotemporal area, never anywhere else, with spread into the left ear area and sometimes associated with left neck pain.  The patient denies any nausea or vomiting but she does have photophobia and phonophobia.  She may have frequent tearing of the left eye.  She reports a heavy sensation on the right arm and right leg that has been present for 1 year.  She denies any dizziness or loss of vision.  Both parents had no headache but the patient has 1 brother and 5 sisters, and all her siblings have headache.  She is sent here for further evaluation.   REVIEW OF SYSTEMS: Out of a complete 14 system review of symptoms, the patient complains only of the following symptoms, and all other reviewed systems are negative.  headache  ALLERGIES: Allergies  Allergen Reactions   Quinine Derivatives Hives    HOME MEDICATIONS: Outpatient Medications Prior to Visit  Medication Sig Dispense Refill   metFORMIN (GLUCOPHAGE) 1000 MG tablet Take 0.5 tablets (500 mg total) by mouth 2 (two) times daily. 30 tablet 0   SUMAtriptan (IMITREX) 100 MG tablet Take 1 tablet at earliest onset of headache.  May repeat in 2 hours if headache persists/recurs.  Do not exceed 2 tablets in 24 hours 10 tablet 2   tobramycin (TOBREX)  0.3 % ophthalmic solution Place 1 drop into the left eye every 4 (four) hours. 5 mL 0   topiramate (TOPAMAX) 50 MG tablet Take 1 tablet (50 mg total) by mouth at bedtime. 90 tablet 3   No facility-administered medications prior to visit.    PAST MEDICAL HISTORY: Past Medical History:  Diagnosis Date   Diabetes mellitus without complication (HCC)    Hypertension     PAST SURGICAL HISTORY: No past surgical history on file.   FAMILY  HISTORY: No family history on file.  SOCIAL HISTORY: Social History   Socioeconomic History   Marital status: Married    Spouse name: Komlan   Number of children: 3   Years of education: 7th    Highest education level: Not on file  Occupational History   Occupation: not working  Tobacco Use   Smoking status: Never   Smokeless tobacco: Never  Vaping Use   Vaping Use: Never used  Substance and Sexual Activity   Alcohol use: No   Drug use: No   Sexual activity: Yes    Birth control/protection: None  Other Topics Concern   Not on file  Social History Narrative   Lives with husband and 3 children.  Not working.  Education: 7th grade.    Right Handed   Drinks caffeine rarely   Social Determinants of Health   Financial Resource Strain: Not on file  Food Insecurity: Not on file  Transportation Needs: Not on file  Physical Activity: Not on file  Stress: Not on file  Social Connections: Not on file  Intimate Partner Violence: Not on file   PHYSICAL EXAM  There were no vitals filed for this visit.   There is no height or weight on file to calculate BMI.  Generalized: Well developed, in no acute distress   Neurological examination  Mentation: Alert, husband translates mostly, she speaks limited english, she is smiling, participatory Cranial nerve II-XII: Pupils were equal round reactive to light. Extraocular movements were full, visual field were full on confrontational test. Facial sensation and strength were normal. Head turning and shoulder shrug  were normal and symmetric. Motor: The motor testing reveals 5 over 5 strength of all 4 extremities. Good symmetric motor tone is noted throughout.  Sensory: Sensory testing is intact to soft touch on all 4 extremities. No evidence of extinction is noted.  Coordination: Cerebellar testing reveals good finger-nose-finger and heel-to-shin bilaterally.  Gait and station: Gait is normal. Tandem gait is normal.  Reflexes: Deep  tendon reflexes are symmetric and normal bilaterally.   DIAGNOSTIC DATA (LABS, IMAGING, TESTING) - I reviewed patient records, labs, notes, testing and imaging myself where available.  Lab Results  Component Value Date   WBC 6.2 05/03/2021   HGB 12.5 05/03/2021   HCT 39.3 05/03/2021   MCV 86.2 05/03/2021   PLT 269 05/03/2021      Component Value Date/Time   NA 138 05/03/2021 1338   K 3.7 05/03/2021 1338   CL 107 05/03/2021 1338   CO2 23 05/03/2021 1338   GLUCOSE 195 (H) 05/03/2021 1338   BUN 9 05/03/2021 1338   CREATININE 0.93 05/03/2021 1338   CREATININE 0.83 05/12/2013 0929   CALCIUM 9.7 05/03/2021 1338   PROT 6.9 05/03/2021 1338   ALBUMIN 3.2 (L) 05/03/2021 1338   AST 13 (L) 05/03/2021 1338   ALT 14 05/03/2021 1338   ALKPHOS 76 05/03/2021 1338   BILITOT 1.7 (H) 05/03/2021 1338   GFRNONAA >60 05/03/2021 1338   GFRAA >  60 04/27/2020 1829   Lab Results  Component Value Date   CHOL 168 05/12/2013   HDL 48 05/12/2013   LDLCALC 111 (H) 05/12/2013   TRIG 45 05/12/2013   CHOLHDL 3.5 05/12/2013   Lab Results  Component Value Date   HGBA1C 5.1 05/12/2013   No results found for: [id number] Lab Results  Component Value Date   TSH 1.589 05/12/2013   ASSESSMENT AND PLAN 46 y.o. year old female  1.  Chronic daily headache 2.  History of reported right arm and leg heaviness (resolved)  -She is doing very well with Topamax, will continue Topamax 50 mg at bedtime for headache prevention -MRI of the brain from August 2022 was unremarkable -Call for increase in headache, otherwise we will follow-up in 1 year or sooner if needed, if she continues to do well we will try to taper off Topamax, have her follow-up on an as-needed basis, since Dr. Anne Hahn has retired she will be followed by Dr. Levonne Lapping, AGNP-C, DNP 01/07/2022, 5:52 AM [facility] [address] [place], [place] [address] [phone]

## 2022-01-07 ENCOUNTER — Telehealth: Payer: Self-pay

## 2022-01-07 ENCOUNTER — Ambulatory Visit: Payer: Medicaid Other | Admitting: Neurology

## 2022-01-07 ENCOUNTER — Encounter: Payer: Self-pay | Admitting: Neurology

## 2022-01-07 NOTE — Telephone Encounter (Signed)
Patient left a voicemail at 8:30 AM asking to cancel her appointment for today.  She would like a call back to reschedule.  She can be reached at [phone].

## 2022-06-17 ENCOUNTER — Encounter: Payer: Self-pay | Admitting: Neurology

## 2022-08-14 ENCOUNTER — Ambulatory Visit: Payer: Medicaid Other | Admitting: Neurology

## 2022-10-01 ENCOUNTER — Telehealth: Payer: Self-pay | Admitting: Neurology

## 2022-10-01 MED ORDER — TOPIRAMATE 50 MG PO TABS: 50.0000 mg | ORAL_TABLET | Freq: Every day | ORAL | 0 refills | Status: DC

## 2022-10-01 NOTE — Telephone Encounter (Signed)
I refilled her Topamax, will see her this week. We can discuss who will be her new neurologist,.  Meds ordered this encounter  Medications   topiramate (TOPAMAX) 50 MG tablet    Sig: Take 1 tablet (50 mg total) by mouth at bedtime.    Dispense:  90 tablet    Refill:  0

## 2022-10-01 NOTE — Telephone Encounter (Signed)
Tanya [name] Patients husband came in to reschedule her appt because she needs meds. She will be here Thursday to see you. Patient initially saw Dr. Jannifer Franklin, since he retired who will she be assigned too incase we can't get a sooner appointment with you. They speak [place] so not sure if Dr. April Manson can be assigned and she will continue care with you but her son may ask and [name] figured [name] would give you a heads up. Thank you

## 2022-10-01 NOTE — Telephone Encounter (Signed)
Noted  

## 2022-10-01 NOTE — Addendum Note (Signed)
Addended by: Suzzanne Cloud on: 10/01/2022 04:54 PM   Modules accepted: Orders

## 2022-10-03 ENCOUNTER — Encounter: Payer: Self-pay | Admitting: Neurology

## 2022-10-03 ENCOUNTER — Ambulatory Visit: Payer: Medicaid Other | Admitting: Neurology

## 2022-10-03 VITALS — BP 125/86 | HR 77 | Ht 65.0 in | Wt 162.0 lb

## 2022-10-03 DIAGNOSIS — R519 Headache, unspecified: Secondary | ICD-10-CM

## 2022-10-03 MED ORDER — TOPIRAMATE 50 MG PO TABS: 75.0000 mg | ORAL_TABLET | Freq: Every day | ORAL | 3 refills | Status: DC

## 2022-10-03 NOTE — Progress Notes (Signed)
PATIENT: Tanya [name] DOB: Jun 05, 1976  REASON FOR VISIT: follow up HISTORY FROM: patient, son Primary Neurologist: Dr. April Manson  Today 10/03/22 Here today with her son, he translates [place].  Has been taking Topamax 50 mg at bedtime with very good improvement in headache.  Still has left-sided temporal headache about 2 times a week.  Can last up to an hour, takes Tylenol with good benefit.  She is quite pleased with Topamax, tolerates well.  Continues to work as a Engineer, civil (consulting).  Has been doing quite well.  Update 08/14/21 SS: Tanya [name] here today for follow-up for headaches. MRI of the brain was unremarkable in Aug 2022. Here today with her husband. He translates, taking Topamax 50 mg at bedtime. Tolerating well. No longer having the bad headaches. Now, triggered by lack of sleep. Her husband has noted a big improvement. They didn't want interpreter services. She is a Engineer, civil (consulting), after probing has noted some tingling as side effect of Topamax. No other issues. Denies weakness on the right side.Tried a lot of medicine since she was 14 for headaches.  Update 02/12/2021 SS: Dakotah Weeda is a 47 year old female with history of daily headaches on the left side. Every 3rd day the headache is significant. Is very poor historian. Here with husband. [place] interpreter arrived, they turned away. Husband translates. Topamax sent last visit I think she may have taken, but then her diabetes worsened, so she stopped, but am not clear, as she doesn't know her medications. She works as a Engineer, civil (consulting), headaches are not bad enough to keep her in the bed.  Denies any continued complaint of right-sided weakness.  No changes to her overall health.  Here today accompanied by her husband.  MRI of the brain was never done.  HISTORY 09/18/2020 Dr. Jannifer Franklin: [name] is a 46 year old right-handed black female from [place] with a history of essentially daily headaches since age 47.  The patient comes in with her  husband today who acts as her interpreter but unfortunately he does not have a high level of English speaking skills.  Apparently, the patient was seen previously by Dr. Tomi Likens in 2018.  She was placed on nortriptyline but never followed up afterwards.  The patient herself does not recall whether the medication was tolerated or whether it helped.  They have been given a prescription for Imitrex that she is taking frequently for her headache.  The headache is generally in the left frontotemporal area, never anywhere else, with spread into the left ear area and sometimes associated with left neck pain.  The patient denies any nausea or vomiting but she does have photophobia and phonophobia.  She may have frequent tearing of the left eye.  She reports a heavy sensation on the right arm and right leg that has been present for 1 year.  She denies any dizziness or loss of vision.  Both parents had no headache but the patient has 1 brother and 5 sisters, and all her siblings have headache.  She is sent here for further evaluation.   REVIEW OF SYSTEMS: Out of a complete 14 system review of symptoms, the patient complains only of the following symptoms, and all other reviewed systems are negative.  headache  ALLERGIES: Allergies  Allergen Reactions   Quinine Derivatives Hives    HOME MEDICATIONS: Outpatient Medications Prior to Visit  Medication Sig Dispense Refill   metFORMIN (GLUCOPHAGE) 1000 MG tablet Take 0.5 tablets (500 mg total) by mouth 2 (two) times daily.  30 tablet 0   SUMAtriptan (IMITREX) 100 MG tablet Take 1 tablet at earliest onset of headache.  May repeat in 2 hours if headache persists/recurs.  Do not exceed 2 tablets in 24 hours 10 tablet 2   tobramycin (TOBREX) 0.3 % ophthalmic solution Place 1 drop into the left eye every 4 (four) hours. 5 mL 0   topiramate (TOPAMAX) 50 MG tablet Take 1 tablet (50 mg total) by mouth at bedtime. 90 tablet 0   No facility-administered medications prior to  visit.    PAST MEDICAL HISTORY: Past Medical History:  Diagnosis Date   Diabetes mellitus without complication ([place])    Hypertension     PAST SURGICAL HISTORY: No past surgical history on file.   FAMILY HISTORY: No family history on file.  SOCIAL HISTORY: Social History   Socioeconomic History   Marital status: Married    Spouse name: Komlan   Number of children: 3   Years of education: 7th    Highest education level: Not on file  Occupational History   Occupation: not working  Tobacco Use   Smoking status: Never   Smokeless tobacco: Never  Vaping Use   Vaping Use: Never used  Substance and Sexual Activity   Alcohol use: No   Drug use: No   Sexual activity: Yes    Birth control/protection: None  Other Topics Concern   Not on file  Social History Narrative   Lives with husband and 3 children.  Not working.  Education: 7th grade.    Right Handed   Drinks caffeine rarely   Social Determinants of Health   Financial Resource Strain: Not on file  Food Insecurity: Not on file  Transportation Needs: Not on file  Physical Activity: Not on file  Stress: Not on file  Social Connections: Not on file  Intimate Partner Violence: Not on file   PHYSICAL EXAM  Vitals:   10/03/22 0800  BP: 125/86  Pulse: 77  Weight: 162 lb (73.5 kg)  Height: 5' 5"$  (1.651 m)   Body mass index is 26.96 kg/m.  Generalized: Well developed, in no acute distress   Neurological examination  Mentation: Alert, son provides history, she does speak fairly good English, very nice, cooperative Cranial nerve II-XII: Pupils were equal round reactive to light. Extraocular movements were full, visual field were full on confrontational test. Facial sensation and strength were normal. Head turning and shoulder shrug  were normal and symmetric. Motor: The motor testing reveals 5 over 5 strength of all 4 extremities. Good symmetric motor tone is noted throughout.  Sensory: Sensory testing is intact  to soft touch on all 4 extremities. No evidence of extinction is noted.  Coordination: Cerebellar testing reveals good finger-nose-finger and heel-to-shin bilaterally.  Gait and station: Gait is normal.  Reflexes: Deep tendon reflexes are symmetric and normal bilaterally.   DIAGNOSTIC DATA (LABS, IMAGING, TESTING) - I reviewed patient records, labs, notes, testing and imaging myself where available.  Lab Results  Component Value Date   WBC 6.2 05/03/2021   HGB 12.5 05/03/2021   HCT 39.3 05/03/2021   MCV 86.2 05/03/2021   PLT 269 05/03/2021      Component Value Date/Time   NA 138 05/03/2021 1338   K 3.7 05/03/2021 1338   CL 107 05/03/2021 1338   CO2 23 05/03/2021 1338   GLUCOSE 195 (H) 05/03/2021 1338   BUN 9 05/03/2021 1338   CREATININE 0.93 05/03/2021 1338   CREATININE 0.83 05/12/2013 0929   CALCIUM 9.7  05/03/2021 1338   PROT 6.9 05/03/2021 1338   ALBUMIN 3.2 (L) 05/03/2021 1338   AST 13 (L) 05/03/2021 1338   ALT 14 05/03/2021 1338   ALKPHOS 76 05/03/2021 1338   BILITOT 1.7 (H) 05/03/2021 1338   GFRNONAA >60 05/03/2021 1338   GFRAA >60 04/27/2020 1829   Lab Results  Component Value Date   CHOL 168 05/12/2013   HDL 48 05/12/2013   LDLCALC 111 (H) 05/12/2013   TRIG 45 05/12/2013   CHOLHDL 3.5 05/12/2013   Lab Results  Component Value Date   HGBA1C 5.1 05/12/2013   No results found for: "VITAMINB12" Lab Results  Component Value Date   TSH 1.589 05/12/2013   ASSESSMENT AND PLAN 47 y.o. year old female  has a past medical history of Diabetes mellitus without complication ([place]) and Hypertension. here with:  1.  Chronic daily headache 2.  History of reported right arm and leg heaviness (resolved)  -Increase Topamax 75 mg at bedtime for headache prevention -Continue Tylenol as needed for acute headache -Next steps: Increase Topamax, consider nortriptyline or Effexor (only nortriptyline was prescribed by prior neurologist, unclear if she tried), may consider  Maxalt or Imitrex for acute headache relief if needed -MRI of the brain from August 2022 was unremarkable -Follow-up in 6 months or sooner if needed, MyChart message if dose adjustment needed   Butler Denmark, AGNP-C, DNP 10/03/2022, 8:09 AM [facility] [address], [place], [place] [address] [phone]

## 2022-10-03 NOTE — Patient Instructions (Signed)
Increase Topamax to 75 mg at bedtime for headache prevention, if you continue to have frequent headache, we can further increase the dose, let me know  You can continue to take tylenol as needed for acute headache  See you back in 6 months

## 2022-10-16 ENCOUNTER — Other Ambulatory Visit: Payer: Self-pay | Admitting: Neurology

## 2022-12-28 ENCOUNTER — Other Ambulatory Visit: Payer: Self-pay | Admitting: Neurology

## 2023-04-01 ENCOUNTER — Ambulatory Visit: Payer: Medicaid Other | Admitting: Neurology

## 2023-04-10 NOTE — Progress Notes (Deleted)
PATIENT: Tanya [name] DOB: 21-Nov-1975  REASON FOR VISIT: follow up HISTORY FROM: patient, son Primary Neurologist: Dr. Teresa Coombs  Today 04/10/23   Update 10/03/22 SS: Here today with her son, he translates [place] dialect.  Has been taking Topamax 50 mg at bedtime with very good improvement in headache.  Still has left-sided temporal headache about 2 times a week.  Can last up to an hour, takes Tylenol with good benefit.  She is quite pleased with Topamax, tolerates well.  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  Has been doing quite well.  Update 08/14/21 SS: Tanya [name] here today for follow-up for headaches. MRI of the brain was unremarkable in Aug 2022. Here today with her husband. He translates, taking Topamax 50 mg at bedtime. Tolerating well. No longer having the bad headaches. Now, triggered by lack of sleep. Her husband has noted a big improvement. They didn't want interpreter services. 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 No other issues. Denies weakness on the right side.Tried a lot of medicine since she was 14 for headaches.  Update 02/12/2021 SS: Tanya [name] is a 47 year old female with history of daily headaches on the left side. Every 3rd day the headache is significant. Is very poor historian. Here with husband. [place] interpreter arrived, they turned away. Husband translates. Topamax sent last visit I think she may have taken, but then her diabetes worsened, so she stopped, but am not clear, as she doesn't know her medications. She works as a Product/process development scientist, headaches are not bad enough to keep her in the bed.  Denies any continued complaint of right-sided weakness.  No changes to her overall health.  Here today accompanied by her husband.  MRI of the brain was never done.  HISTORY 09/18/2020 Dr. Anne Hahn: [name] is a 47 year old right-handed black female from [place] with a history of essentially daily headaches since age 15.  The patient  comes in with her husband today who acts as her interpreter but unfortunately he does not have a high level of English speaking skills.  Apparently, the patient was seen previously by Dr. Everlena Cooper in 2018.  She was placed on nortriptyline but never followed up afterwards.  The patient herself does not recall whether the medication was tolerated or whether it helped.  They have been given a prescription for Imitrex that she is taking frequently for her headache.  The headache is generally in the left frontotemporal area, never anywhere else, with spread into the left ear area and sometimes associated with left neck pain.  The patient denies any nausea or vomiting but she does have photophobia and phonophobia.  She may have frequent tearing of the left eye.  She reports a heavy sensation on the right arm and right leg that has been present for 1 year.  She denies any dizziness or loss of vision.  Both parents had no headache but the patient has 1 brother and 5 sisters, and all her siblings have headache.  She is sent here for further evaluation.   REVIEW OF SYSTEMS: Out of a complete 14 system review of symptoms, the patient complains only of the following symptoms, and all other reviewed systems are negative.  headache  ALLERGIES: Allergies  Allergen Reactions   Quinine Derivatives Hives    HOME MEDICATIONS: Outpatient Medications Prior to Visit  Medication Sig Dispense Refill   metFORMIN (GLUCOPHAGE) 1000 MG tablet Take 0.5 tablets (500 mg total) by  mouth 2 (two) times daily. 30 tablet 0   SUMAtriptan (IMITREX) 100 MG tablet Take 1 tablet at earliest onset of headache.  May repeat in 2 hours if headache persists/recurs.  Do not exceed 2 tablets in 24 hours 10 tablet 2   tobramycin (TOBREX) 0.3 % ophthalmic solution Place 1 drop into the left eye every 4 (four) hours. 5 mL 0   topiramate (TOPAMAX) 50 MG tablet TAKE 1 TABLET(50 MG) BY MOUTH AT BEDTIME 90 tablet 0   No facility-administered medications  prior to visit.    PAST MEDICAL HISTORY: Past Medical History:  Diagnosis Date   Diabetes mellitus without complication (HCC)    Hypertension     PAST SURGICAL HISTORY: No past surgical history on file.   FAMILY HISTORY: No family history on file.  SOCIAL HISTORY: Social History   Socioeconomic History   Marital status: Married    Spouse name: Komlan   Number of children: 3   Years of education: 7th    Highest education level: Not on file  Occupational History   Occupation: not working  Tobacco Use   Smoking status: Never   Smokeless tobacco: Never  Vaping Use   Vaping status: Never Used  Substance and Sexual Activity   Alcohol use: No   Drug use: No   Sexual activity: Yes    Birth control/protection: None  Other Topics Concern   Not on file  Social History Narrative   Lives with husband and 3 children.  Not working.  Education: 7th grade.    Right Handed   Drinks caffeine rarely   Social Determinants of Health   Financial Resource Strain: Not on file  Food Insecurity: Not on file  Transportation Needs: Not on file  Physical Activity: Not on file  Stress: Not on file  Social Connections: Unknown (01/01/2022)   [facility], [facility]   Social Network    Social Network: Not on file  Intimate Partner Violence: Unknown (11/23/2021)   [facility] from [facility], [facility]   HITS    Physically Hurt: Not on file    Insult or Talk Down To: Not on file    Threaten Physical Harm: Not on file    Scream or Curse: Not on file   PHYSICAL EXAM  There were no vitals filed for this visit.  There is no height or weight on file to calculate BMI.  Generalized: Well developed, in no acute distress   Neurological examination  Mentation: Alert, son provides history, she does speak fairly good English, very nice, cooperative Cranial nerve II-XII: Pupils were equal round reactive to light. Extraocular movements were full, visual field were full  on confrontational test. Facial sensation and strength were normal. Head turning and shoulder shrug  were normal and symmetric. Motor: The motor testing reveals 5 over 5 strength of all 4 extremities. Good symmetric motor tone is noted throughout.  Sensory: Sensory testing is intact to soft touch on all 4 extremities. No evidence of extinction is noted.  Coordination: Cerebellar testing reveals good finger-nose-finger and heel-to-shin bilaterally.  Gait and station: Gait is normal.  Reflexes: Deep tendon reflexes are symmetric and normal bilaterally.   DIAGNOSTIC DATA (LABS, IMAGING, TESTING) - I reviewed patient records, labs, notes, testing and imaging myself where available.  Lab Results  Component Value Date   WBC 6.2 05/03/2021   HGB 12.5 05/03/2021   HCT 39.3 05/03/2021   MCV 86.2 05/03/2021   PLT 269 05/03/2021  Component Value Date/Time   NA 138 05/03/2021 1338   K 3.7 05/03/2021 1338   CL 107 05/03/2021 1338   CO2 23 05/03/2021 1338   GLUCOSE 195 (H) 05/03/2021 1338   BUN 9 05/03/2021 1338   CREATININE 0.93 05/03/2021 1338   CREATININE 0.83 05/12/2013 0929   CALCIUM 9.7 05/03/2021 1338   PROT 6.9 05/03/2021 1338   ALBUMIN 3.2 (L) 05/03/2021 1338   AST 13 (L) 05/03/2021 1338   ALT 14 05/03/2021 1338   ALKPHOS 76 05/03/2021 1338   BILITOT 1.7 (H) 05/03/2021 1338   GFRNONAA >60 05/03/2021 1338   GFRAA >60 04/27/2020 1829   Lab Results  Component Value Date   CHOL 168 05/12/2013   HDL 48 05/12/2013   LDLCALC 111 (H) 05/12/2013   TRIG 45 05/12/2013   CHOLHDL 3.5 05/12/2013   Lab Results  Component Value Date   HGBA1C 5.1 05/12/2013   No results found for: "VITAMINB12" Lab Results  Component Value Date   TSH 1.589 05/12/2013   ASSESSMENT AND PLAN 47 y.o. year old female  has a past medical history of Diabetes mellitus without complication ([facility]) and Hypertension. here with:  1.  Chronic daily headache 2.  History of reported right arm and leg heaviness  (resolved)  -Increase Topamax 75 mg at bedtime for headache prevention -Continue Tylenol as needed for acute headache -Next steps: Increase Topamax, consider nortriptyline or Effexor (only nortriptyline was prescribed by prior neurologist, unclear if she tried), may consider Maxalt or Imitrex for acute headache relief if needed -MRI of the brain from August 2022 was unremarkable -Follow-up in 6 months or sooner if needed, MyChart message if dose adjustment needed   Otila Kluver, DNP 04/10/2023, 3:56 PM [facility] [address] [place], [place] [address] [phone]

## 2023-04-15 ENCOUNTER — Ambulatory Visit: Payer: Self-pay | Admitting: Neurology

## 2023-11-15 ENCOUNTER — Encounter (HOSPITAL_COMMUNITY): Payer: Self-pay

## 2023-11-15 ENCOUNTER — Emergency Department (HOSPITAL_COMMUNITY)
Admission: EM | Admit: 2023-11-15 | Discharge: 2023-11-15 | Disposition: A | Discharge status: Home / Self Care | Attending: Emergency Medicine | Admitting: Emergency Medicine

## 2023-11-15 ENCOUNTER — Other Ambulatory Visit: Payer: Self-pay

## 2023-11-15 DIAGNOSIS — I10 Essential (primary) hypertension: Secondary | ICD-10-CM | POA: Diagnosis not present

## 2023-11-15 DIAGNOSIS — R42 Dizziness and giddiness: Secondary | ICD-10-CM | POA: Diagnosis not present

## 2023-11-15 DIAGNOSIS — N390 Urinary tract infection, site not specified: Secondary | ICD-10-CM | POA: Diagnosis not present

## 2023-11-15 DIAGNOSIS — R531 Weakness: Secondary | ICD-10-CM | POA: Insufficient documentation

## 2023-11-15 DIAGNOSIS — E860 Dehydration: Secondary | ICD-10-CM | POA: Diagnosis present

## 2023-11-15 DIAGNOSIS — R112 Nausea with vomiting, unspecified: Secondary | ICD-10-CM | POA: Insufficient documentation

## 2023-11-15 DIAGNOSIS — Z7984 Long term (current) use of oral hypoglycemic drugs: Secondary | ICD-10-CM | POA: Diagnosis not present

## 2023-11-15 DIAGNOSIS — E1165 Type 2 diabetes mellitus with hyperglycemia: Secondary | ICD-10-CM | POA: Insufficient documentation

## 2023-11-15 DIAGNOSIS — Z79899 Other long term (current) drug therapy: Secondary | ICD-10-CM | POA: Insufficient documentation

## 2023-11-15 LAB — BASIC METABOLIC PANEL WITH GFR
Anion gap: 11 (ref 5–15)
BUN: 15 mg/dL (ref 6–20)
CO2: 22 mmol/L (ref 22–32)
Calcium: 9.7 mg/dL (ref 8.9–10.3)
Chloride: 103 mmol/L (ref 98–111)
Creatinine, Ser: 1.18 mg/dL — ABNORMAL HIGH (ref 0.44–1.00)
GFR, Estimated: 57 mL/min — ABNORMAL LOW (ref >60)
Glucose, Bld: 110 mg/dL — ABNORMAL HIGH (ref 70–99)
Potassium: 3.4 mmol/L — ABNORMAL LOW (ref 3.5–5.1)
Sodium: 136 mmol/L (ref 135–145)

## 2023-11-15 LAB — CBC
HCT: 46.7 % — ABNORMAL HIGH (ref 36.0–46.0)
Hemoglobin: 15.7 g/dL — ABNORMAL HIGH (ref 12.0–15.0)
MCH: 28.5 pg (ref 26.0–34.0)
MCHC: 33.6 g/dL (ref 30.0–36.0)
MCV: 84.8 fL (ref 80.0–100.0)
Platelets: 227 10*3/uL (ref 150–400)
RBC: 5.51 MIL/uL — ABNORMAL HIGH (ref 3.87–5.11)
RDW: 11.7 % (ref 11.5–15.5)
WBC: 6 10*3/uL (ref 4.0–10.5)
nRBC: 0 % (ref 0.0–0.2)

## 2023-11-15 LAB — URINALYSIS, ROUTINE W REFLEX MICROSCOPIC
Bilirubin Urine: NEGATIVE
Glucose, UA: NEGATIVE mg/dL
Hgb urine dipstick: NEGATIVE
Ketones, ur: 20 mg/dL — AB
Nitrite: NEGATIVE
Protein, ur: NEGATIVE mg/dL
Specific Gravity, Urine: 1.016 (ref 1.005–1.030)
pH: 5 (ref 5.0–8.0)

## 2023-11-15 LAB — CBG MONITORING, ED: Glucose-Capillary: 103 mg/dL — ABNORMAL HIGH (ref 70–99)

## 2023-11-15 MED ORDER — ONDANSETRON HCL 4 MG/2ML IJ SOLN
4.0000 mg | Freq: Once | INTRAMUSCULAR | Status: AC
  Administered 2023-11-15: 4 mg via INTRAVENOUS
  Filled 2023-11-15: qty 2

## 2023-11-15 MED ORDER — ONDANSETRON HCL 4 MG PO TABS: 4.0000 mg | ORAL_TABLET | Freq: Three times a day (TID) | ORAL | 0 refills | Status: DC | PRN

## 2023-11-15 MED ORDER — POTASSIUM CHLORIDE CRYS ER 20 MEQ PO TBCR
40.0000 meq | EXTENDED_RELEASE_TABLET | Freq: Once | ORAL | Status: AC
  Administered 2023-11-15: 40 meq via ORAL
  Filled 2023-11-15: qty 2

## 2023-11-15 MED ORDER — ACETAMINOPHEN 500 MG PO TABS
1000.0000 mg | ORAL_TABLET | Freq: Once | ORAL | Status: AC
  Administered 2023-11-15: 1000 mg via ORAL
  Filled 2023-11-15: qty 2

## 2023-11-15 MED ORDER — SODIUM CHLORIDE 0.9 % IV BOLUS
1000.0000 mL | Freq: Once | INTRAVENOUS | Status: AC
  Administered 2023-11-15: 1000 mL via INTRAVENOUS

## 2023-11-15 MED ORDER — CEPHALEXIN 500 MG PO CAPS: 500.0000 mg | ORAL_CAPSULE | Freq: Four times a day (QID) | ORAL | 0 refills | Status: DC

## 2023-11-15 NOTE — ED Provider Notes (Signed)
 [place] EMERGENCY DEPARTMENT AT [facility] Provider Note  CSN: [id number] Arrival date & time: 11/15/23 1606  Chief Complaint(s) Weakness and Dizziness  HPI Tanya [name] is a 48 y.o. female history of diabetes, hypertension presenting to the emergency department with nausea and vomiting.  Patient reports that she used Ozempic around 4 days ago and subsequently has had nausea and vomiting, not able to tolerate much p.o.  No abdominal pain.  No fevers or chills.  No chest pain or shortness of breath.  No lightheadedness or dizziness.  No syncope.  She does report some dysuria lately and decreased urine output.  She went to urgent care today, they were concerned that she was dehydrated so they sent her to the hospital for further evaluation.  Patient's husband is translating.  Patient declined Healthcare interpreter.  Patient's husband speaks fluent [place].   Past Medical History Past Medical History:  Diagnosis Date   Diabetes mellitus without complication (HCC)    Hypertension    Patient Active Problem List   Diagnosis Date Noted   Chronic daily headache 02/12/2021   Newly diagnosed diabetes (HCC) 10/20/2011   DKA (diabetic ketoacidosis) (HCC) 10/20/2011   Home Medication(s) Prior to Admission medications   Medication Sig Start Date End Date Taking? Authorizing Provider  metFORMIN (GLUCOPHAGE) 1000 MG tablet Take 0.5 tablets (500 mg total) by mouth 2 (two) times daily. 09/26/20   Lorelee New, PA-C  SUMAtriptan (IMITREX) 100 MG tablet Take 1 tablet at earliest onset of headache.  May repeat in 2 hours if headache persists/recurs.  Do not exceed 2 tablets in 24 hours 11/12/16   Shon Millet R, DO  tobramycin (TOBREX) 0.3 % ophthalmic solution Place 1 drop into the left eye every 4 (four) hours. 03/29/20   Wallis Bamberg, PA-C  topiramate (TOPAMAX) 50 MG tablet TAKE 1 TABLET(50 MG) BY MOUTH AT BEDTIME 12/30/22   Glean Salvo, NP  nortriptyline (PAMELOR) 25 MG capsule Take 1  capsule (25 mg total) by mouth at bedtime. 11/12/16 03/29/20  Drema Dallas, DO                                                                                                                                    Past Surgical History History reviewed. No pertinent surgical history. Family History History reviewed. No pertinent family history.  Social History Social History   Tobacco Use   Smoking status: Never   Smokeless tobacco: Never  Vaping Use   Vaping status: Never Used  Substance Use Topics   Alcohol use: No   Drug use: No   Allergies Quinine derivatives  Review of Systems Review of Systems  All other systems reviewed and are negative.   Physical Exam Vital Signs  I have reviewed the triage vital signs BP 121/86   Pulse 93   Temp 98 F (36.7 C)   Resp 19   LMP 07/30/2016  SpO2 100%  Physical Exam Vitals and nursing note reviewed.  Constitutional:      General: She is not in acute distress.    Appearance: She is well-developed.  HENT:     Head: Normocephalic and atraumatic.     Mouth/Throat:     Mouth: Mucous membranes are dry.  Eyes:     Pupils: Pupils are equal, round, and reactive to light.  Cardiovascular:     Rate and Rhythm: Normal rate and regular rhythm.     Heart sounds: No murmur heard. Pulmonary:     Effort: Pulmonary effort is normal. No respiratory distress.     Breath sounds: Normal breath sounds.  Abdominal:     General: Abdomen is flat.     Palpations: Abdomen is soft.     Tenderness: There is no abdominal tenderness.  Musculoskeletal:        General: No tenderness.     Right lower leg: No edema.     Left lower leg: No edema.  Skin:    General: Skin is warm and dry.  Neurological:     General: No focal deficit present.     Mental Status: She is alert. Mental status is at baseline.  Psychiatric:        Mood and Affect: Mood normal.        Behavior: Behavior normal.     ED Results and Treatments Labs (all labs ordered are  listed, but only abnormal results are displayed) Labs Reviewed  BASIC METABOLIC PANEL WITH GFR - Abnormal; Notable for the following components:      Result Value   Potassium 3.4 (*)    Glucose, Bld 110 (*)    Creatinine, Ser 1.18 (*)    GFR, Estimated 57 (*)    All other components within normal limits  CBC - Abnormal; Notable for the following components:   RBC 5.51 (*)    Hemoglobin 15.7 (*)    HCT 46.7 (*)    All other components within normal limits  CBG MONITORING, ED - Abnormal; Notable for the following components:   Glucose-Capillary 103 (*)    All other components within normal limits  URINALYSIS, ROUTINE W REFLEX MICROSCOPIC                                                                                                                          Radiology No results found.  Pertinent labs & imaging results that were available during my care of the patient were reviewed by me and considered in my medical decision making (see MDM for details).  Medications Ordered in ED Medications  sodium chloride 0.9 % bolus 1,000 mL (has no administration in time range)  ondansetron (ZOFRAN) injection 4 mg (has no administration in time range)  potassium chloride SA (KLOR-CON M) CR tablet 40 mEq (has no administration in time range)  Procedures Procedures  (including critical care time)  Medical Decision Making / ED Course   MDM:  48 year old presenting to the emergency department with nausea and vomiting.  Suspect symptoms are likely related to Ozempic or possible gastroparesis.  She has no abdominal pain or tenderness on exam to suggest any intra-abdominal process such as appendicitis, cholecystitis, pancreatitis, perforation, abscess.  No headaches to suggest any intracranial process.  She does have some dysuria so we will check for UTI.  She  reports that she has had similar episodes when she has tried Ozempic previously.  Will give some antinausea medication, IV fluids.  Labs do show mild dehydration and hemoconcentration.  Patient is already feeling better and does request to eat so we will p.o. trial.  Anticipate likely discharge      Additional history obtained: -Additional history obtained from {wsadditionalhistorian:28072} -External records from outside source obtained and reviewed including: Chart review including previous notes, labs, imaging, consultation notes including ***   Lab Tests: -I ordered, reviewed, and interpreted labs.   The pertinent results include:   Labs Reviewed  BASIC METABOLIC PANEL WITH GFR - Abnormal; Notable for the following components:      Result Value   Potassium 3.4 (*)    Glucose, Bld 110 (*)    Creatinine, Ser 1.18 (*)    GFR, Estimated 57 (*)    All other components within normal limits  CBC - Abnormal; Notable for the following components:   RBC 5.51 (*)    Hemoglobin 15.7 (*)    HCT 46.7 (*)    All other components within normal limits  CBG MONITORING, ED - Abnormal; Notable for the following components:   Glucose-Capillary 103 (*)    All other components within normal limits  URINALYSIS, ROUTINE W REFLEX MICROSCOPIC    Notable for ***  EKG   EKG Interpretation Date/Time:    Ventricular Rate:    PR Interval:    QRS Duration:    QT Interval:    QTC Calculation:   R Axis:      Text Interpretation:           Imaging Studies ordered: I ordered imaging studies including *** On my interpretation imaging demonstrates *** I independently visualized and interpreted imaging. I agree with the radiologist interpretation   Medicines ordered and prescription drug management: Meds ordered this encounter  Medications   sodium chloride 0.9 % bolus 1,000 mL   ondansetron (ZOFRAN) injection 4 mg   potassium chloride SA (KLOR-CON M) CR tablet 40 mEq    -I have reviewed  the patients home medicines and have made adjustments as needed   Consultations Obtained: I requested consultation with the ***,  and discussed lab and imaging findings as well as pertinent plan - they recommend: ***   Cardiac Monitoring: The patient was maintained on a cardiac monitor.  I personally viewed and interpreted the cardiac monitored which showed an underlying rhythm of: ***  Social Determinants of Health:  Diagnosis or treatment significantly limited by social determinants of health: {wssoc:28071}   Reevaluation: After the interventions noted above, I reevaluated the patient and found that their symptoms have {resolved/improved/worsened:23923::"improved"}  Co morbidities that complicate the patient evaluation  Past Medical History:  Diagnosis Date   Diabetes mellitus without complication (HCC)    Hypertension       Dispostion: Disposition decision including need for hospitalization was considered, and patient {wsdispo:28070::"discharged from emergency department."}    Final Clinical Impression(s) / ED Diagnoses Final diagnoses:  None     This chart was dictated using voice recognition software.  Despite best efforts to proofread,  errors can occur which can change the documentation meaning.

## 2023-11-15 NOTE — Discharge Instructions (Signed)
 Please do not take any more Ozempic as this seems to cause you to develop nausea and vomiting.  Please follow-up with your primary care doctor.  We have prescribed you nausea medication to take as needed for nausea or vomiting.

## 2023-11-15 NOTE — Progress Notes (Signed)
 IV obtained by MD.

## 2023-11-15 NOTE — ED Triage Notes (Signed)
 Pt sent by [facility] for further evaluation of possible ozempic induced dehydration. Pt c.o nausea, dizziness and pt not eating or drinking for the past 4 days.

## 2023-11-15 NOTE — ED Provider Notes (Signed)
 Seen after prior EDP.  Patient is comfortable.  She understands need for close outpatient follow-up.  Will treat for possible UTI.  Strict return precautions given and understood.   [name], MD 11/15/23 2227

## 2023-11-16 ENCOUNTER — Telehealth (HOSPITAL_COMMUNITY): Payer: Self-pay | Admitting: Emergency Medicine

## 2023-11-16 MED ORDER — CEPHALEXIN 500 MG PO CAPS: 500.0000 mg | ORAL_CAPSULE | Freq: Four times a day (QID) | ORAL | 0 refills | Status: DC

## 2023-11-16 MED ORDER — ONDANSETRON HCL 4 MG PO TABS: 4.0000 mg | ORAL_TABLET | Freq: Three times a day (TID) | ORAL | 0 refills | Status: AC | PRN

## 2023-11-16 NOTE — Telephone Encounter (Cosign Needed)
 Patient calls requesting cephalexin and ondansetron be sent to [facility] on [facility] [address] [facility] as.  Prescription has been changed to this pharmacy.

## 2024-02-04 IMAGING — MR MR ORBITS WO/W CM
17 of 19 series · 38 of 48 positions shown · IV contrast (multihance)
Comparison: Head CT 10/30/2008.

CLINICAL DATA: 45-year-old female with visual disturbance, blurred
vision x2 years. No known injury.

EXAM:
MRI HEAD AND ORBITS WITHOUT AND WITH CONTRAST
TECHNIQUE: Multiplanar, multiecho pulse sequences of the brain and surrounding
structures were obtained without and with intravenous contrast.
Multiplanar, multiecho pulse sequences of the orbits and surrounding
structures were obtained including fat saturation techniques, before
and after intravenous contrast administration.
CONTRAST:  15mL MULTIHANCE GADOBENATE DIMEGLUMINE 529 MG/ML IV SOLN

[Series 3: DWI · axial · 3.0mm · 1.80mm/px · z∈[-43,+104]mm · 6 of 100 slices shown (1 of 4)]
[im 1/100]
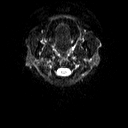
[im 20/100]
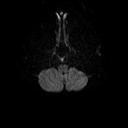
[im 40/100]
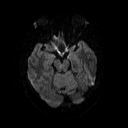
[im 60/100]
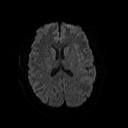
[im 80/100]
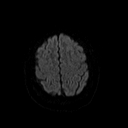
[im 100/100]
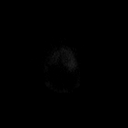

[Series 4: DWI · axial · 3.0mm · 1.80mm/px · z∈[-43,+104]mm · 4 of 50 slices shown (2 of 4)]
[im 1/50]
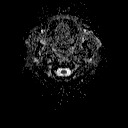
[im 17/50]
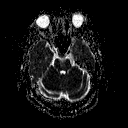
[im 33/50]
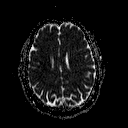
[im 50/50]
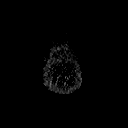

[Series 5: DWI · coronal · 5.0mm · 1.80mm/px · 4 of 68 slices shown (3 of 4)]
[im 1/68]
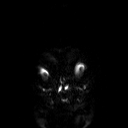
[im 23/68]
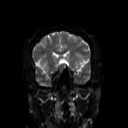
[im 45/68]
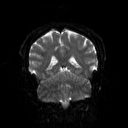
[im 68/68]
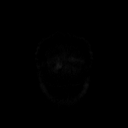

[Series 6: DWI · coronal · 5.0mm · 1.80mm/px · 2 of 34 slices shown (4 of 4)]
[im 1/34]
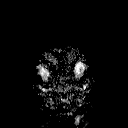
[im 34/34]
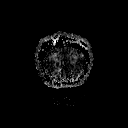

[Series 7: FLAIR · axial · 3.0mm · 0.45mm/px · 1 of 25 slices shown]
[im 1/25]
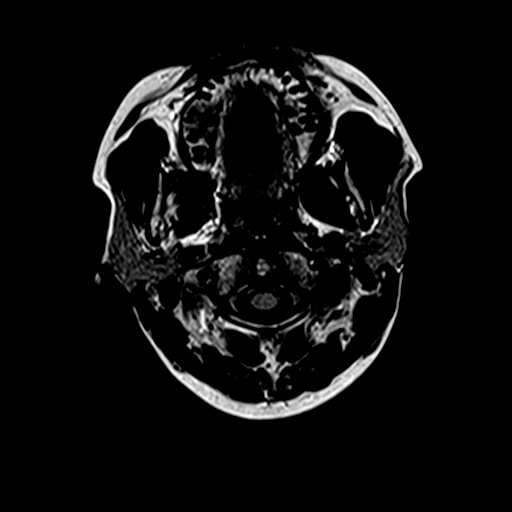

[Series 8: T2 · axial · 5.0mm · 0.51mm/px · 1 of 23 slices shown (1 of 2)]
[im 1/23]
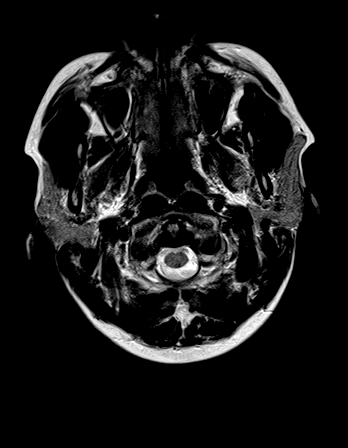

[Series 9: swi_images · axial · 2.0mm · 0.90mm/px · z∈[-42,+116]mm · 4 of 80 slices shown]
[im 1/80]
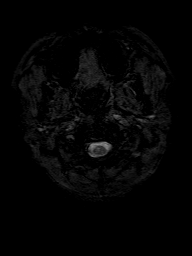
[im 27/80]
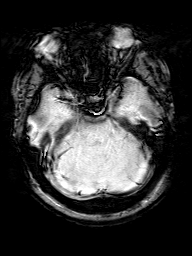
[im 53/80]
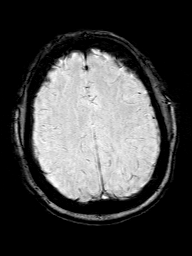
[im 80/80]
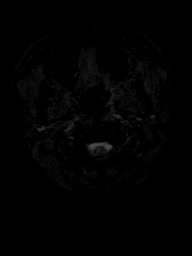

[Series 10: t1_mpr_tra copy center · axial · 1.0mm · 0.45mm/px · z∈[-50,+86]mm · 7 of 160 slices shown]
[im 1/160]
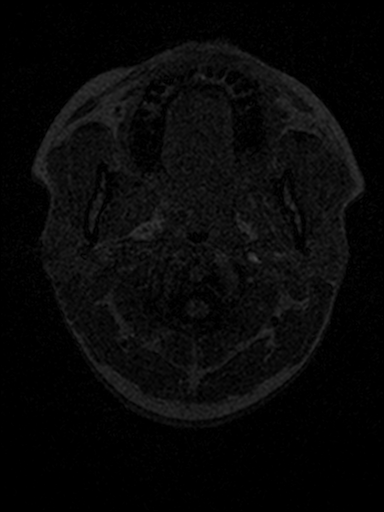
[im 23/160]
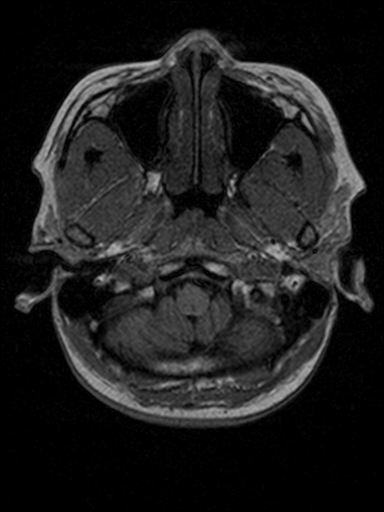
[im 46/160]
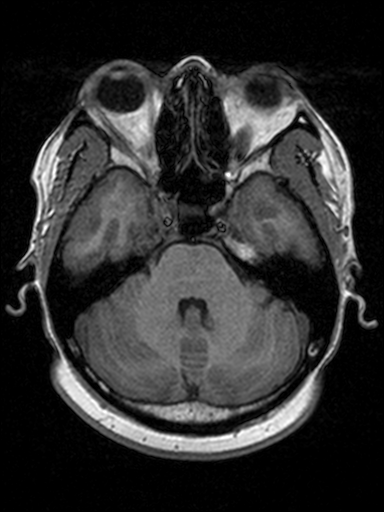
[im 69/160]
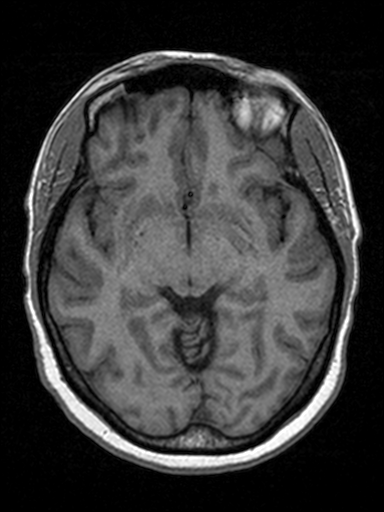
[im 91/160]
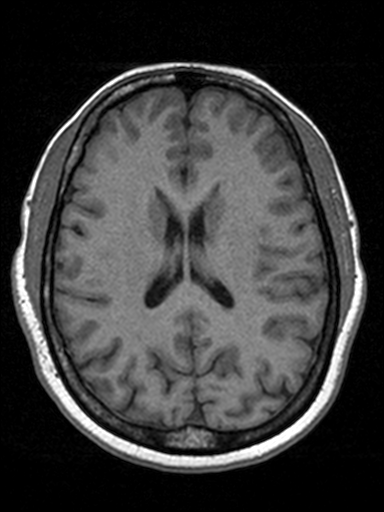
[im 114/160]
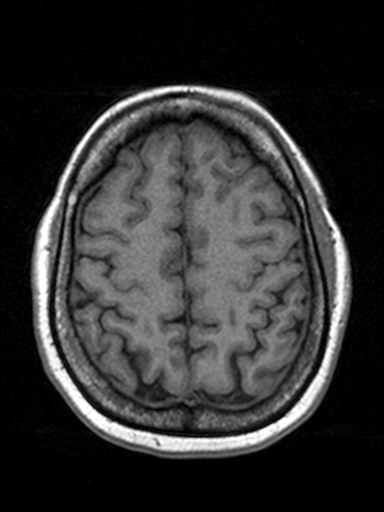
[im 137/160]
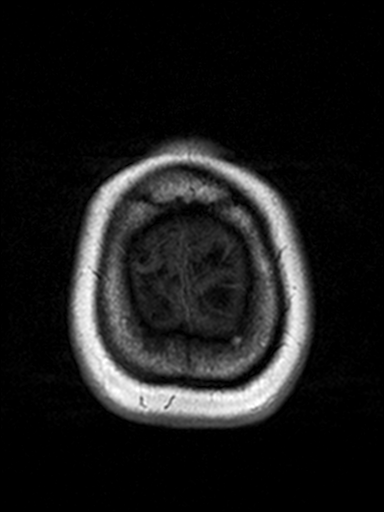

[Series 11: T2 · coronal · 5.0mm · 0.45mm/px · 1 of 27 slices shown (2 of 2)]
[im 1/27]
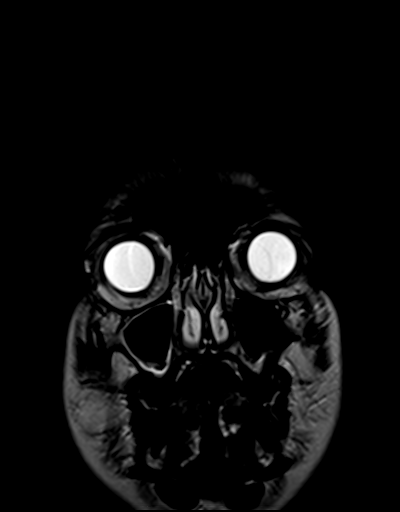

[Series 12: T1 · coronal · 3.0mm · 0.35mm/px · 1 of 25 slices shown (1 of 3)]
[im 1/25]
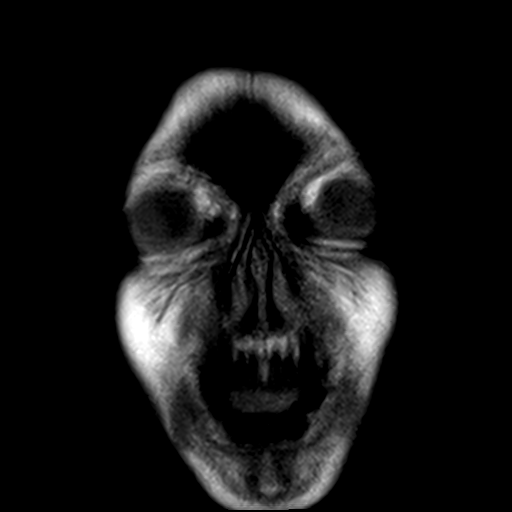

[Series 13: T1 · axial · 3.0mm · 0.35mm/px · 1 of 17 slices shown (2 of 3)]
[im 1/17]
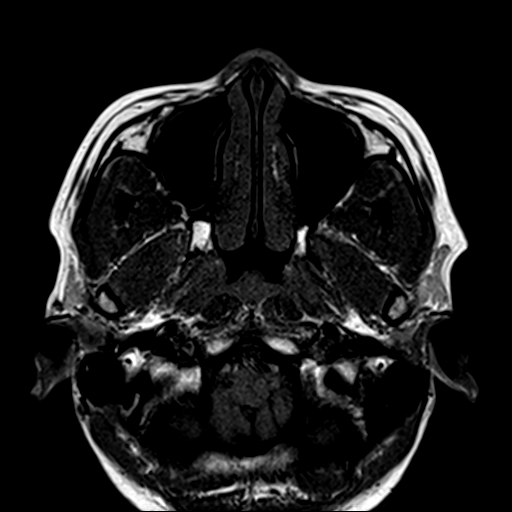

[Series 14: T1 · coronal · 3.0mm · 0.35mm/px · 1 of 25 slices shown (3 of 3)]
[im 1/25]
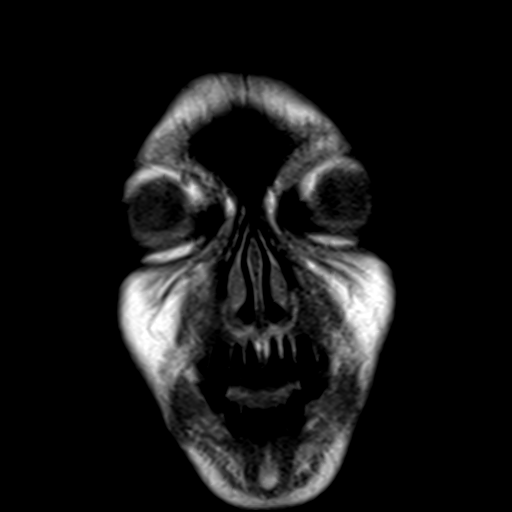

[Series 15: T2 fat-sat · axial · 3.0mm · 0.35mm/px · 1 of 17 slices shown (1 of 2)]
[im 1/17]
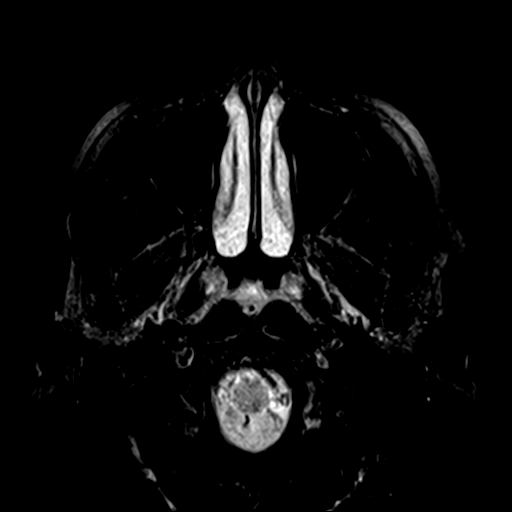

[Series 16: T2 fat-sat · coronal · 3.0mm · 0.35mm/px · 1 of 25 slices shown (2 of 2)]
[im 1/25]
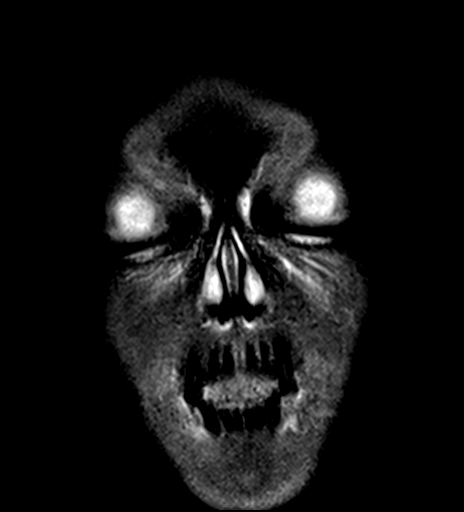

[Series 18: T1 post-contrast · coronal · 5.0mm · 0.45mm/px · 1 of 27 slices shown]
[im 1/27]
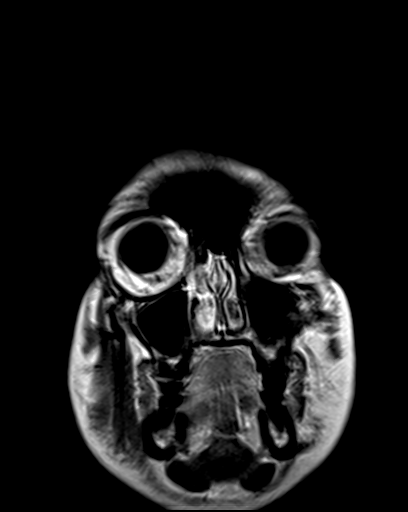

[Series 19: T1 fat-sat post-contrast · coronal · 3.0mm · 0.35mm/px · 1 of 25 slices shown (1 of 2)]
[im 1/25]
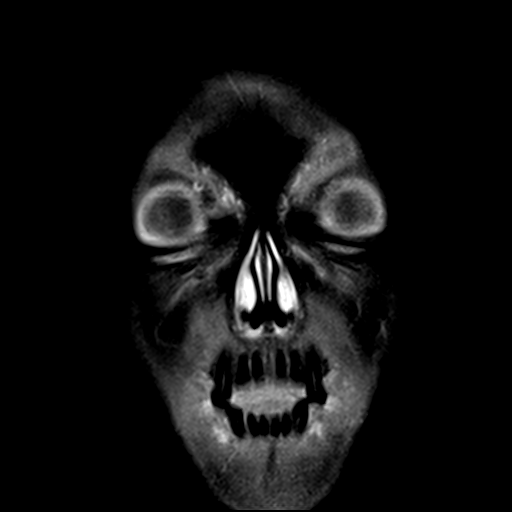

[Series 20: T1 fat-sat post-contrast · axial · 3.0mm · 0.35mm/px · 1 of 17 slices shown (2 of 2)]
[im 1/17]
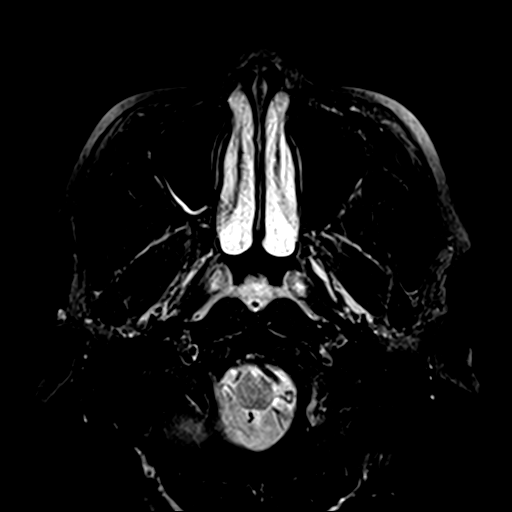

[38 of 48 positions shown; findings below may reference images not displayed]

FINDINGS: MRI HEAD FINDINGS

Brain: Normal cerebral volume. No restricted diffusion to suggest
acute infarction. No midline shift, mass effect, evidence of mass
lesion, ventriculomegaly, extra-axial collection or acute
intracranial hemorrhage. Cervicomedullary junction and pituitary are
within normal limits.

Gray and white matter signal is within normal limits for age
throughout the brain. No encephalomalacia or chronic cerebral blood
products identified.

No abnormal enhancement identified. No dural thickening.

Vascular: Major intracranial vascular flow voids are preserved.
Major dural venous sinuses are enhancing and appear patent.

Skull and upper cervical spine: Negative. Visualized bone marrow
signal is within normal limits.

Other: Grossly normal visible internal auditory structures. Mastoids
are well aerated.

Visualized scalp soft tissues are within normal limits.

MRI ORBITS FINDINGS

Thin slice imaging of the orbits is mildly motion degraded despite
repeated imaging attempts.

Orbits: Normal suprasellar cistern. Optic chiasm appears normal.
Cavernous sinus appears symmetric and normal.

Optic nerves are grossly symmetric and within normal limits. No
intraorbital mass or inflammation identified. No abnormal
intraorbital enhancement. Expected enhancement of symmetric superior
ophthalmic veins. Globes appear intact.

Visualized sinuses: Only trace paranasal sinus mucosal thickening,
appears inconsequential (left sphenoid sinus series 8, image 6). No
sinus fluid levels.

Soft tissues: Visible periorbital and face soft tissues are within
normal limits.
IMPRESSION: Mildly motion degraded but normal MRI appearance of the brain and
orbits. No explanation for abnormal vision.

## 2024-05-11 ENCOUNTER — Encounter: Payer: Self-pay | Admitting: Neurology

## 2024-05-11 ENCOUNTER — Ambulatory Visit: Payer: Self-pay | Admitting: Neurology

## 2024-05-11 VITALS — BP 110/73 | HR 77 | Ht 64.0 in | Wt 169.4 lb

## 2024-05-11 DIAGNOSIS — R519 Headache, unspecified: Secondary | ICD-10-CM | POA: Diagnosis not present

## 2024-05-11 MED ORDER — TOPIRAMATE 50 MG PO TABS: 50.0000 mg | ORAL_TABLET | Freq: Every day | ORAL | 3 refills | Status: AC

## 2024-05-11 NOTE — Patient Instructions (Signed)
 Great to see you today! Continue Topamax  for headache prevention  Use Tylenol  as needed Continue follow up with primary care  Return here for new or worsening symptoms Thanks!

## 2024-05-11 NOTE — Progress Notes (Signed)
 PATIENT: Tanya [name] DOB: 1976-06-16  REASON FOR VISIT: follow up HISTORY FROM: patient, husband Primary Neurologist: Dr. Gregg  Today 05/11/24 Update 05/11/24 SS: Here with her husband.  Headaches under very good control.  She did not increase the Topamax .  Continue taking 50 mg at bedtime.  May have 1 mild left temporal headache weekly, may last 30 minutes, takes Tylenol  with good benefit.  Tolerates Topamax  well.  10/03/22 SS: Here today with her son, he translates [place] dialect.  Has been taking Topamax  50 mg at bedtime with very good improvement in headache.  Still has left-sided temporal headache about 2 times a week.  Can last up to an hour, takes Tylenol  with good benefit.  She is quite pleased with Topamax , tolerates well.  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  Has been doing quite well.  Update 08/14/21 SS: Tanya [name] here today for follow-up for headaches. MRI of the brain was unremarkable in Aug 2022. Here today with her husband. He translates, taking Topamax  50 mg at bedtime. Tolerating well. No longer having the bad headaches. Now, triggered by lack of sleep. Her husband has noted a big improvement. They didn't want interpreter services. She is a Product/process development scientist, after probing has noted some tingling as side effect of Topamax . No other issues. Denies weakness on the right side.Tried a lot of medicine since she was 14 for headaches.  Update 02/12/2021 SS: Tanya [name] is a 48 year old female with history of daily headaches on the left side. Every 3rd day the headache is significant. Is very poor historian. Here with husband. [place] interpreter arrived, they turned away. Husband translates. Topamax  sent last visit I think she may have taken, but then her diabetes worsened, so she stopped, but am not clear, as she doesn't know her medications. She works as a Product/process development scientist, headaches are not bad enough to keep her in the bed.  Denies any continued complaint of right-sided weakness.  No changes to her  overall health.  Here today accompanied by her husband.  MRI of the brain was never done.  HISTORY 09/18/2020 Dr. Jenel: Tanya [name] is a 48 year old right-handed black female from [place] with a history of essentially daily headaches since age 43.  The patient comes in with her husband today who acts as her interpreter but unfortunately he does not have a high level of English speaking skills.  Apparently, the patient was seen previously by Dr. Skeet in 2018.  She was placed on nortriptyline  but never followed up afterwards.  The patient herself does not recall whether the medication was tolerated or whether it helped.  They have been given a prescription for Imitrex  that she is taking frequently for her headache.  The headache is generally in the left frontotemporal area, never anywhere else, with spread into the left ear area and sometimes associated with left neck pain.  The patient denies any nausea or vomiting but she does have photophobia and phonophobia.  She may have frequent tearing of the left eye.  She reports a heavy sensation on the right arm and right leg that has been present for 1 year.  She denies any dizziness or loss of vision.  Both parents had no headache but the patient has 1 brother and 5 sisters, and all her siblings have headache.  She is sent here for further evaluation.   REVIEW OF SYSTEMS: Out of a complete 14 system review of symptoms, the patient complains only of the following symptoms, and all  other reviewed systems are negative.  headache  ALLERGIES: Allergies  Allergen Reactions   Quinine Derivatives Hives    HOME MEDICATIONS: Outpatient Medications Prior to Visit  Medication Sig Dispense Refill   atorvastatin (LIPITOR) 20 MG tablet Take 20 mg by mouth at bedtime.     lisinopril-hydrochlorothiazide (ZESTORETIC) 10-12.5 MG tablet Take 1 tablet by mouth daily.     meloxicam (MOBIC) 7.5 MG tablet Take 7.5 mg by mouth daily.     metFORMIN  (GLUCOPHAGE ) 1000 MG  tablet Take 0.5 tablets (500 mg total) by mouth 2 (two) times daily. 30 tablet 0   tobramycin  (TOBREX ) 0.3 % ophthalmic solution Place 1 drop into the left eye every 4 (four) hours. 5 mL 0   topiramate  (TOPAMAX ) 50 MG tablet TAKE 1 TABLET(50 MG) BY MOUTH AT BEDTIME 90 tablet 0   cephALEXin  (KEFLEX ) 500 MG capsule Take 1 capsule (500 mg total) by mouth 4 (four) times daily. (Patient not taking: Reported on 05/11/2024) 28 capsule 0   ondansetron  (ZOFRAN ) 4 MG tablet Take 1 tablet (4 mg total) by mouth every 8 (eight) hours as needed for nausea or vomiting. (Patient not taking: Reported on 05/11/2024) 12 tablet 0   SUMAtriptan  (IMITREX ) 100 MG tablet Take 1 tablet at earliest onset of headache.  May repeat in 2 hours if headache persists/recurs.  Do not exceed 2 tablets in 24 hours (Patient not taking: Reported on 05/11/2024) 10 tablet 2   No facility-administered medications prior to visit.    PAST MEDICAL HISTORY: Past Medical History:  Diagnosis Date   Diabetes mellitus without complication (HCC)    Hypertension     PAST SURGICAL HISTORY: History reviewed. No pertinent surgical history.   FAMILY HISTORY: History reviewed. No pertinent family history.  SOCIAL HISTORY: Social History   Socioeconomic History   Marital status: Married    Spouse name: Komlan   Number of children: 3   Years of education: 7th    Highest education level: Not on file  Occupational History   Occupation: not working  Tobacco Use   Smoking status: Never   Smokeless tobacco: Never  Vaping Use   Vaping status: Never Used  Substance and Sexual Activity   Alcohol use: No   Drug use: No   Sexual activity: Yes    Birth control/protection: None  Other Topics Concern   Not on file  Social History Narrative   Lives with husband and 3 children.  Not working.  Education: 7th grade.    Right Handed   Drinks caffeine rarely    Social Drivers of Corporate investment banker Strain: Not on file  Food  Insecurity: Not on file  Transportation Needs: Not on file  Physical Activity: Not on file  Stress: Not on file  Social Connections: Unknown (01/01/2022)   [facility]   Social Network    Social Network: Not on file  Intimate Partner Violence: Unknown (11/23/2021)   Received from [facility]   HITS    Physically Hurt: Not on file    Insult or Talk Down To: Not on file    Threaten Physical Harm: Not on file    Scream or Curse: Not on file   PHYSICAL EXAM  Vitals:   05/11/24 0846  BP: 110/73  Pulse: 77  Weight: 169 lb 6.4 oz (76.8 kg)  Height: 5' 4 (1.626 m)    Body mass index is 29.08 kg/m.  Generalized: Well developed, in no acute distress   Neurological examination  Mentation: Alert, son provides history, she does speak fairly good English, very nice, cooperative Cranial nerve II-XII: Pupils were equal round reactive to light. Extraocular movements were full, visual field were full on confrontational test. Facial sensation and strength were normal. Head turning and shoulder shrug  were normal and symmetric. Motor: The motor testing reveals 5 over 5 strength of all 4 extremities. Good symmetric motor tone is noted throughout.  Sensory: Sensory testing is intact to soft touch on all 4 extremities. No evidence of extinction is noted.  Coordination: Cerebellar testing reveals good finger-nose-finger and heel-to-shin bilaterally.  Gait and station: Gait is normal.   DIAGNOSTIC DATA (LABS, IMAGING, TESTING) - I reviewed patient records, labs, notes, testing and imaging myself where available.  Lab Results  Component Value Date   WBC 6.0 11/15/2023   HGB 15.7 (H) 11/15/2023   HCT 46.7 (H) 11/15/2023   MCV 84.8 11/15/2023   PLT 227 11/15/2023      Component Value Date/Time   NA 136 11/15/2023 1623   K 3.4 (L) 11/15/2023 1623   CL 103 11/15/2023 1623   CO2 22 11/15/2023 1623   GLUCOSE 110 (H) 11/15/2023 1623   BUN 15 11/15/2023 1623   CREATININE 1.18  (H) 11/15/2023 1623   CREATININE 0.83 05/12/2013 0929   CALCIUM 9.7 11/15/2023 1623   PROT 6.9 05/03/2021 1338   ALBUMIN 3.2 (L) 05/03/2021 1338   AST 13 (L) 05/03/2021 1338   ALT 14 05/03/2021 1338   ALKPHOS 76 05/03/2021 1338   BILITOT 1.7 (H) 05/03/2021 1338   GFRNONAA 57 (L) 11/15/2023 1623   GFRAA >60 04/27/2020 1829   Lab Results  Component Value Date   CHOL 168 05/12/2013   HDL 48 05/12/2013   LDLCALC 111 (H) 05/12/2013   TRIG 45 05/12/2013   CHOLHDL 3.5 05/12/2013   Lab Results  Component Value Date   HGBA1C 5.1 05/12/2013   No results found for: [id number] Lab Results  Component Value Date   TSH 1.589 05/12/2013   ASSESSMENT AND PLAN 48 y.o. year old female  has a past medical history of Diabetes mellitus without complication (HCC) and Hypertension. here with:  1.  Chronic daily headache 2.  History of reported right arm and leg heaviness (resolved)  -Headaches under good control, continue Topamax  50 mg at bedtime, refilled -Continue Tylenol  as needed for acute headache -Next steps: Increase Topamax , consider nortriptyline  or Effexor (nortriptyline  was prescribed by prior neurologist, unclear if she tried), may consider Maxalt or Imitrex  for acute headache relief if needed -MRI of the brain with and without contrast April 2023 was normal - Follow-up with primary care, return here for new or worsening symptoms  Lauraine Born, AGNP-C, DNP 05/11/2024, 9:12 AM [facility] [address], [address] [place], [place] [address] [phone]

## 2024-05-18 ENCOUNTER — Other Ambulatory Visit: Payer: Self-pay | Admitting: Nurse Practitioner

## 2024-05-18 DIAGNOSIS — Z1231 Encounter for screening mammogram for malignant neoplasm of breast: Secondary | ICD-10-CM

## 2024-05-31 ENCOUNTER — Emergency Department (HOSPITAL_COMMUNITY)
Admission: EM | Admit: 2024-05-31 | Discharge: 2024-06-01 | Disposition: A | Discharge status: Home / Self Care | Attending: Emergency Medicine | Admitting: Emergency Medicine

## 2024-05-31 ENCOUNTER — Other Ambulatory Visit: Payer: Self-pay

## 2024-05-31 ENCOUNTER — Encounter (HOSPITAL_COMMUNITY): Payer: Self-pay

## 2024-05-31 ENCOUNTER — Emergency Department (HOSPITAL_COMMUNITY)

## 2024-05-31 DIAGNOSIS — R079 Chest pain, unspecified: Secondary | ICD-10-CM | POA: Diagnosis present

## 2024-05-31 DIAGNOSIS — K222 Esophageal obstruction: Secondary | ICD-10-CM | POA: Insufficient documentation

## 2024-05-31 DIAGNOSIS — R1310 Dysphagia, unspecified: Secondary | ICD-10-CM

## 2024-05-31 LAB — CBC
HCT: 45.4 % (ref 36.0–46.0)
Hemoglobin: 14.6 g/dL (ref 12.0–15.0)
MCH: 28.2 pg (ref 26.0–34.0)
MCHC: 32.2 g/dL (ref 30.0–36.0)
MCV: 87.8 fL (ref 80.0–100.0)
Platelets: 202 K/uL (ref 150–400)
RBC: 5.17 MIL/uL — ABNORMAL HIGH (ref 3.87–5.11)
RDW: 12.6 % (ref 11.5–15.5)
WBC: 5.8 K/uL (ref 4.0–10.5)
nRBC: 0 % (ref 0.0–0.2)

## 2024-05-31 LAB — BASIC METABOLIC PANEL WITH GFR
Anion gap: 13 (ref 5–15)
BUN: 13 mg/dL (ref 6–20)
CO2: 20 mmol/L — ABNORMAL LOW (ref 22–32)
Calcium: 9.7 mg/dL (ref 8.9–10.3)
Chloride: 105 mmol/L (ref 98–111)
Creatinine, Ser: 1.18 mg/dL — ABNORMAL HIGH (ref 0.44–1.00)
GFR, Estimated: 57 mL/min — ABNORMAL LOW (ref >60)
Glucose, Bld: 103 mg/dL — ABNORMAL HIGH (ref 70–99)
Potassium: 3.1 mmol/L — ABNORMAL LOW (ref 3.5–5.1)
Sodium: 138 mmol/L (ref 135–145)

## 2024-05-31 LAB — RESP PANEL BY RT-PCR (RSV, FLU A&B, COVID)  RVPGX2
Influenza A by PCR: NEGATIVE
Influenza B by PCR: NEGATIVE
Resp Syncytial Virus by PCR: NEGATIVE
SARS Coronavirus 2 by RT PCR: NEGATIVE

## 2024-05-31 LAB — TROPONIN I (HIGH SENSITIVITY)
Troponin I (High Sensitivity): 2 ng/L (ref <18)
Troponin I (High Sensitivity): 2 ng/L (ref <18)

## 2024-05-31 LAB — GROUP A STREP BY PCR: Group A Strep by PCR: NOT DETECTED

## 2024-05-31 NOTE — ED Provider Triage Note (Signed)
 Emergency Medicine Provider Triage Evaluation Note  Tanya [name] , a 48 y.o. female  was evaluated in triage.  Pt complains of sore throat for 4 days. Handling secretions and normal phonation. Seems to radiate to neck. No tenderness or swelling to ant neck.   Review of Systems  Positive: Sore throat, CP Negative: SOB  Physical Exam  BP 114/85 (BP Location: Left Arm)   Pulse 82   Temp 98.4 F (36.9 C) (Oral)   Resp 16   LMP 07/30/2016   SpO2 97%  Gen:   Awake, no distress   Resp:  Normal effort  MSK:   Moves extremities without difficulty  Other:    Medical Decision Making  Medically screening exam initiated at 7:26 PM.  Appropriate orders placed.  Tanya [name] was informed that the remainder of the evaluation will be completed by another provider, this initial triage assessment does not replace that evaluation, and the importance of remaining in the ED until their evaluation is complete.     Tanya Warren SAILOR, PA-C 05/31/24 [id number]

## 2024-05-31 NOTE — ED Triage Notes (Signed)
 Pt to ED with complaint of chest pain radiating to throat and to epigastric abd x6 days. +difficulty breathing. No fevers or vomiting.

## 2024-06-01 ENCOUNTER — Emergency Department (HOSPITAL_COMMUNITY)

## 2024-06-01 MED ORDER — LIDOCAINE VISCOUS HCL 2 % MT SOLN: 15.0000 mL | OROMUCOSAL | 0 refills | Status: DC | PRN

## 2024-06-01 MED ORDER — IOHEXOL 350 MG/ML SOLN
75.0000 mL | Freq: Once | INTRAVENOUS | Status: AC | PRN
  Administered 2024-06-01: 75 mL via INTRAVENOUS

## 2024-06-01 MED ORDER — AMOXICILLIN 500 MG PO CAPS: 1000.0000 mg | ORAL_CAPSULE | Freq: Two times a day (BID) | ORAL | 0 refills | Status: AC

## 2024-06-01 MED ORDER — PANTOPRAZOLE SODIUM 40 MG PO TBEC: 40.0000 mg | DELAYED_RELEASE_TABLET | Freq: Every day | ORAL | 3 refills | Status: DC

## 2024-06-01 MED ORDER — LIDOCAINE VISCOUS HCL 2 % MT SOLN: 15.0000 mL | OROMUCOSAL | 0 refills | Status: AC | PRN

## 2024-06-01 MED ORDER — AMOXICILLIN 500 MG PO CAPS: 1000.0000 mg | ORAL_CAPSULE | Freq: Two times a day (BID) | ORAL | 0 refills | Status: DC

## 2024-06-01 MED ORDER — DEXAMETHASONE SOD PHOSPHATE PF 10 MG/ML IJ SOLN
10.0000 mg | Freq: Once | INTRAMUSCULAR | Status: AC
  Administered 2024-06-01: 10 mg via INTRAVENOUS

## 2024-06-01 MED ORDER — SUCRALFATE 1 G PO TABS: 1.0000 g | ORAL_TABLET | Freq: Three times a day (TID) | ORAL | 0 refills | Status: DC

## 2024-06-01 MED ORDER — SUCRALFATE 1 G PO TABS: 1.0000 g | ORAL_TABLET | Freq: Three times a day (TID) | ORAL | 0 refills | Status: AC

## 2024-06-01 NOTE — ED Provider Notes (Signed)
 [place] EMERGENCY DEPARTMENT AT [facility] Provider Note   CSN: [id number] Arrival date & time: 05/31/24  1731     Patient presents with: Chest Pain   Tanya [name] is a 48 y.o. female.   Patient presents to the emergency department for evaluation of throat and chest discomfort.  She reports that this has happened before and she had surgery for it.  She is having constant discomfort and is causing her to not eat or drink.  She currently feels like there is something stuck in her throat.       Prior to Admission medications   Medication Sig Start Date End Date Taking? Authorizing Provider  amoxicillin (AMOXIL) 500 MG capsule Take 2 capsules (1,000 mg total) by mouth 2 (two) times daily. 06/01/24  Yes Remberto Lienhard, Lonni PARAS, MD  lidocaine  (XYLOCAINE ) 2 % solution Use as directed 15 mLs in the mouth or throat every 3 (three) hours as needed (throat pain). 06/01/24  Yes Mildred Tuccillo, Lonni PARAS, MD  pantoprazole (PROTONIX) 40 MG tablet Take 1 tablet (40 mg total) by mouth daily. 06/01/24  Yes Bartholomew Ramesh, Lonni PARAS, MD  sucralfate (CARAFATE) 1 g tablet Take 1 tablet (1 g total) by mouth 4 (four) times daily -  with meals and at bedtime. 06/01/24  Yes Pariss Hommes, Lonni PARAS, MD  atorvastatin (LIPITOR) 20 MG tablet Take 20 mg by mouth at bedtime. 04/24/24   [provider]  lisinopril-hydrochlorothiazide (ZESTORETIC) 10-12.5 MG tablet Take 1 tablet by mouth daily. 03/26/24   [provider]  meloxicam (MOBIC) 7.5 MG tablet Take 7.5 mg by mouth daily. 11/15/23   [provider]  metFORMIN  (GLUCOPHAGE ) 1000 MG tablet Take 0.5 tablets (500 mg total) by mouth 2 (two) times daily. 09/26/20   Landy Honora CROME, PA-C  ondansetron  (ZOFRAN ) 4 MG tablet Take 1 tablet (4 mg total) by mouth every 8 (eight) hours as needed for nausea or vomiting. Patient not taking: Reported on 05/11/2024 11/16/23   Veta Palma, PA-C  SUMAtriptan  (IMITREX ) 100 MG tablet Take 1 tablet at  earliest onset of headache.  May repeat in 2 hours if headache persists/recurs.  Do not exceed 2 tablets in 24 hours Patient not taking: Reported on 05/11/2024 11/12/16   Skeet Juliene SAUNDERS, DO  tobramycin  (TOBREX ) 0.3 % ophthalmic solution Place 1 drop into the left eye every 4 (four) hours. 03/29/20   Makinzee Durley Savannah, PA-C  topiramate  (TOPAMAX ) 50 MG tablet Take 1 tablet (50 mg total) by mouth at bedtime. 05/11/24   Gayland Lauraine PARAS, NP  nortriptyline  (PAMELOR ) 25 MG capsule Take 1 capsule (25 mg total) by mouth at bedtime. 11/12/16 03/29/20  Skeet Juliene SAUNDERS, DO    Allergies: Quinine derivatives    Review of Systems  Updated Vital Signs BP 117/89   Pulse 80   Temp 98.3 F (36.8 C) (Oral)   Resp 18   Ht 5' 4 (1.626 m)   Wt 76.8 kg   LMP 07/30/2016   SpO2 100%   BMI 29.08 kg/m   Physical Exam Vitals and nursing note reviewed.  Constitutional:      General: She is not in acute distress.    Appearance: She is well-developed.  HENT:     Head: Normocephalic and atraumatic.     Mouth/Throat:     Mouth: Mucous membranes are moist.  Eyes:     General: Vision grossly intact. Gaze aligned appropriately.     Extraocular Movements: Extraocular movements intact.     Conjunctiva/sclera: Conjunctivae normal.  Cardiovascular:     Rate and Rhythm: Normal rate and regular rhythm.     Pulses: Normal pulses.     Heart sounds: Normal heart sounds, S1 normal and S2 normal. No murmur heard.    No friction rub. No gallop.  Pulmonary:     Effort: Pulmonary effort is normal. No respiratory distress.     Breath sounds: Normal breath sounds.  Abdominal:     General: Bowel sounds are normal.     Palpations: Abdomen is soft.     Tenderness: There is no abdominal tenderness. There is no guarding or rebound.     Hernia: No hernia is present.  Musculoskeletal:        General: No swelling.     Cervical back: Full passive range of motion without pain, normal range of motion and neck supple. No spinous process  tenderness or muscular tenderness. Normal range of motion.     Right lower leg: No edema.     Left lower leg: No edema.  Skin:    General: Skin is warm and dry.     Capillary Refill: Capillary refill takes less than 2 seconds.     Findings: No ecchymosis, erythema, rash or wound.  Neurological:     General: No focal deficit present.     Mental Status: She is alert and oriented to person, place, and time.     GCS: GCS eye subscore is 4. GCS verbal subscore is 5. GCS motor subscore is 6.     Cranial Nerves: Cranial nerves 2-12 are intact.     Sensory: Sensation is intact.     Motor: Motor function is intact.     Coordination: Coordination is intact.  Psychiatric:        Attention and Perception: Attention normal.        Mood and Affect: Mood normal.        Speech: Speech normal.        Behavior: Behavior normal.     (all labs ordered are listed, but only abnormal results are displayed) Labs Reviewed  BASIC METABOLIC PANEL WITH GFR - Abnormal; Notable for the following components:      Result Value   Potassium 3.1 (*)    CO2 20 (*)    Glucose, Bld 103 (*)    Creatinine, Ser 1.18 (*)    GFR, Estimated 57 (*)    All other components within normal limits  CBC - Abnormal; Notable for the following components:   RBC 5.17 (*)    All other components within normal limits  RESP PANEL BY RT-PCR (RSV, FLU A&B, COVID)  RVPGX2  GROUP A STREP BY PCR  TROPONIN I (HIGH SENSITIVITY)  TROPONIN I (HIGH SENSITIVITY)    EKG: EKG Interpretation Date/Time:  Monday May 31 2024 19:43:29 EDT Ventricular Rate:  82 PR Interval:  130 QRS Duration:  86 QT Interval:  380 QTC Calculation: 443 R Axis:   96  Text Interpretation:  Suspect arm lead reversal, interpretation assumes no reversal Normal sinus rhythm Right atrial enlargement Lateral infarct , age undetermined Abnormal ECG When compared with ECG of 26-Sep-2020 06:44, No significant change since last tracing Confirmed by Haze Lonni PARAS [phone]) on 06/01/2024 2:52:56 AM  Radiology: CT CHEST W CONTRAST Result Date: 06/01/2024 EXAM: CT CHEST WITH CONTRAST 06/01/2024 04:49:51 AM TECHNIQUE: CT of the chest was performed with the administration of intravenous contrast. 75 mL of iohexol  (OMNIPAQUE ) 350 MG/ML injection was administered. Multiplanar reformatted images are provided for review.  Automated exposure control, iterative reconstruction, and/or weight based adjustment of the mA/kV was utilized to reduce the radiation dose to as low as reasonably achievable. COMPARISON: CT of the abdomen and pelvis dated 04/24/2016. CLINICAL HISTORY: FINDINGS: MEDIASTINUM: Heart and pericardium are unremarkable. The central airways are clear. The esophagus is unremarkable. LYMPH NODES: No mediastinal, hilar or axillary lymphadenopathy. LUNGS AND PLEURA: There is a 3 mm solid nodule present within the lingula along the ventral margin of the left major fissure seen on image 94 of series 4. According to the Fleischner Society pulmonary nodule recommendations, the finding is consistent with a single solid nodule <6 mm and the recommendation is: low-risk patients: no routine follow-up required; high-risk patients: optional CT at 12 months (particularly with suspicious nodule morphology and/or upper lobe location). No focal consolidation or pulmonary edema. No pleural effusion or pneumothorax. SOFT TISSUES/BONES: No acute abnormality of the bones or soft tissues. UPPER ABDOMEN: Limited images of the upper abdomen demonstrate 2 calculi present within the superior pole of the left kidney. A calculus measuring approximately 14 x 11 x 13 mm is present posterolaterally within the superior pole. It is seen on image 140 of series 3. There is also a calculus within the upper pole collecting system seen on image 149, measuring approximately 12 x 8 x 12 mm. There is also multiseptated cystic enlargement of the superior pole measuring approximately 5.3 x 4.7 x 3.4  cm. IMPRESSION: 1. No acute thoracic abnormality to explain dysphagia/esophageal stricture. 2. 3 mm solid pulmonary nodule in the lingula: no routine follow-up for low-risk patients; optional CT at 12 months for high-risk patients per Fleischner Society guidelines. 3. Two nonobstructing left renal calculi in the superior pole, measuring up to 14 mm, which may warrant urologic management depending on symptoms and risk. 4. Multiseptated cystic enlargement of the left kidney superior pole measuring 5.3 x 4.7 x 3.4 cm; further characterization with dedicated renal imaging may be warranted depending on prior characterization and clinical context. Electronically signed by: Evalene Coho MD 06/01/2024 05:16 AM EDT RP Workstation: [id number]   CT Soft Tissue Neck W Contrast Result Date: 06/01/2024 EXAM: CT NECK WITH CONTRAST 06/01/2024 04:49:51 AM TECHNIQUE: CT of the neck was performed with the administration of 75 mL of iohexol  (OMNIPAQUE ) 350 MG/ML injection. Multiplanar reformatted images are provided for review. Automated exposure control, iterative reconstruction, and/or weight based adjustment of the mA/kV was utilized to reduce the radiation dose to as low as reasonably achievable. COMPARISON: None available. CLINICAL HISTORY: Difficulty swallowing, sensation of object in throat. Patient complains of sore throat for 4 days. Handling secretions and normal phonation. Seems to radiate to neck. No tenderness or swelling to anterior neck. FINDINGS: AERODIGESTIVE TRACT: There is mild pharyngeal soft tissue swelling and enhancement, which is fairly symmetric. There is no inflammatory stranding present. There is no evidence of peritonsillar abscess. SALIVARY GLANDS: The parotid and submandibular glands are unremarkable. THYROID: Unremarkable. LYMPH NODES: There are a few shotty cervical lymph nodes. SOFT TISSUES: No mass or fluid collection. 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 A small polypoid mucosal density is present anteriorly within the sphenoid sinuses on the left. LUNGS AND MEDIASTINUM: No acute abnormality. BONES: No focal bone abnormality. IMPRESSION: 1. Mild symmetric pharyngeal soft tissue swelling and enhancement without inflammatory stranding or peritonsillar abscess. Electronically signed by: Evalene Coho MD 06/01/2024 05:02 AM EDT RP Workstation: [id number]   DG Chest 2 View Result Date: 05/31/2024 EXAM:  2 VIEW(S) XRAY OF THE CHEST 05/31/2024 07:12:00 PM COMPARISON: Comparison 06/11/2021. CLINICAL HISTORY: SHOB. Epigastric pain that radiates to the throat with sob; hx of hypertension, diabetes. FINDINGS: LUNGS AND PLEURA: No focal pulmonary opacity. No pulmonary edema. No pleural effusion. No pneumothorax. HEART AND MEDIASTINUM: No acute abnormality of the cardiac and mediastinal silhouettes. BONES AND SOFT TISSUES: No acute osseous abnormality. IMPRESSION: 1. No acute cardiopulmonary process. Electronically signed by: Pinkie Pebbles MD 05/31/2024 07:20 PM EDT RP Workstation: [id number]     Procedures   Medications Ordered in the ED  dexamethasone  (DECADRON ) injection 10 mg (has no administration in time range)  iohexol  (OMNIPAQUE ) 350 MG/ML injection 75 mL (75 mLs Intravenous Contrast Given 06/01/24 0450)                                    Medical Decision Making Amount and/or Complexity of Data Reviewed External Data Reviewed: labs, radiology, ECG and notes. Labs: ordered. Decision-making details documented in ED Course. Radiology: ordered and independent interpretation performed. Decision-making details documented in ED Course. ECG/medicine tests: ordered and independent interpretation performed. Decision-making details documented in ED Course.  Risk Prescription drug management.   Patient with dysphagia, globus sensation, difficulty eating and drinking.  She reports that she had a procedure performed in the  past for this.  This was done by Dr. Tamela at [facility] in [place].  Dr. Tamela is a gastroenterologist, it seems that she had esophageal dilatation for these symptoms in the past.  Patient tried to get an appointment but was unable to.  Patient currently handling secretions.  Vital signs are unremarkable.  She is in no distress.  CT throat shows mild symmetrical edema without any concerning features.  CT chest without any acute findings.  Patient does report some pain with swallowing.  Will treat with antibiotics, a dose of Decadron .  Follow-up with ENT.  Patient was unable to get an appointment with her gastroenterologist, will place consult for [facility].     Final diagnoses:  Odynophagia  Chest pain, unspecified type  Esophageal stricture    ED Discharge Orders          Ordered    Ambulatory referral to Gastroenterology        06/01/24 0715    lidocaine  (XYLOCAINE ) 2 % solution  Every  3 hours PRN        06/01/24 0716    amoxicillin (AMOXIL) 500 MG capsule  2 times daily        06/01/24 0716    sucralfate (CARAFATE) 1 g tablet  3 times daily with meals & bedtime        06/01/24 0716    pantoprazole (PROTONIX) 40 MG tablet  Daily        06/01/24 0716               Haze Lonni PARAS, MD 06/01/24 [phone]

## 2024-06-09 ENCOUNTER — Other Ambulatory Visit (HOSPITAL_BASED_OUTPATIENT_CLINIC_OR_DEPARTMENT_OTHER): Payer: Self-pay | Admitting: Nurse Practitioner

## 2024-06-09 ENCOUNTER — Ambulatory Visit

## 2024-06-09 DIAGNOSIS — Z1231 Encounter for screening mammogram for malignant neoplasm of breast: Secondary | ICD-10-CM

## 2024-06-11 ENCOUNTER — Encounter (HOSPITAL_COMMUNITY): Payer: Self-pay

## 2024-06-11 ENCOUNTER — Telehealth (HOSPITAL_COMMUNITY): Payer: Self-pay | Admitting: Internal Medicine

## 2024-06-11 ENCOUNTER — Ambulatory Visit (HOSPITAL_COMMUNITY)
Admission: EM | Admit: 2024-06-11 | Discharge: 2024-06-11 | Disposition: A | Discharge status: Home / Self Care | Attending: Internal Medicine | Admitting: Internal Medicine

## 2024-06-11 DIAGNOSIS — R52 Pain, unspecified: Secondary | ICD-10-CM

## 2024-06-11 DIAGNOSIS — B349 Viral infection, unspecified: Secondary | ICD-10-CM

## 2024-06-11 DIAGNOSIS — E1165 Type 2 diabetes mellitus with hyperglycemia: Secondary | ICD-10-CM | POA: Diagnosis not present

## 2024-06-11 LAB — POC COVID19/FLU A&B COMBO
Covid Antigen, POC: NEGATIVE
Influenza A Antigen, POC: NEGATIVE
Influenza B Antigen, POC: NEGATIVE

## 2024-06-11 LAB — POCT FASTING CBG KUC MANUAL ENTRY: POCT Glucose (KUC): 140 mg/dL — AB (ref 70–99)

## 2024-06-11 MED ORDER — KETOROLAC TROMETHAMINE 30 MG/ML IJ SOLN
INTRAMUSCULAR | Status: AC
  Filled 2024-06-11: qty 1

## 2024-06-11 MED ORDER — KETOROLAC TROMETHAMINE 30 MG/ML IJ SOLN
30.0000 mg | Freq: Once | INTRAMUSCULAR | Status: AC
  Administered 2024-06-11: 30 mg via INTRAMUSCULAR

## 2024-06-11 MED ORDER — ACETAMINOPHEN 500 MG PO TABS
1000.0000 mg | ORAL_TABLET | Freq: Four times a day (QID) | ORAL | 0 refills | Status: AC | PRN
Start: 2024-06-11 — End: ?

## 2024-06-11 MED ORDER — ACETAMINOPHEN 500 MG PO TABS: 1000.0000 mg | ORAL_TABLET | Freq: Four times a day (QID) | ORAL | 0 refills | Status: AC | PRN

## 2024-06-11 NOTE — ED Provider Notes (Addendum)
 [facility]    CSN: [id number] Arrival date & time: 06/11/24  1025      History   Chief Complaint Chief Complaint  Patient presents with   Fever   Generalized Body Aches   Chest Pain    HPI Tanya [name] is a 48 y.o. female.   Tanya [name] is a 48 y.o. female presenting with husband who provides interpretation for chief complaint of central chest pain, internal fever, and generalized bodyaches that started 2 to 3 days ago on June 08, 2024.  Patient describes fever as hot inside of her body.  She denies cold chills.  She has not had a documented fever at home.  Denies cough, nasal congestion, headache, dizziness, ear pain, and abdominal pain.  Denies nausea and vomiting.  No recent known exposures to sick contacts with similar symptoms to her knowledge, however she was recently in the emergency room on May 31, 2024 where she may have been exposed to sick people.  Patient was recently seen in the emergency room for throat pain/chest pain where she was diagnosed with odynophagia and esophageal stricture.  Patient was placed on amoxicillin for 10 days starting on May 31, 2024 and has finished all of this medication.  She was additionally placed on Protonix and sucralfate which she is still taking with relief of odynophagia.  She tested negative for COVID, flu, and RSV while she was in the emergency room.  She has a follow-up appointment with ear nose and throat on Monday, June 14, 2024.   Fever Associated symptoms: chest pain   Chest Pain Associated symptoms: fever     Past Medical History:  Diagnosis Date   Diabetes mellitus without complication (HCC)    Hypertension     Patient Active Problem List   Diagnosis Date Noted   Chronic daily headache 02/12/2021   Newly diagnosed diabetes (HCC) 10/20/2011   DKA (diabetic ketoacidosis) (HCC) 10/20/2011    History reviewed. No pertinent surgical history.  OB History     Gravida  3   Para  3   Term   3   Preterm      AB      Living  3      SAB      IAB      Ectopic      Multiple      Live Births  3            Home Medications    Prior to Admission medications   Medication Sig Start Date End Date Taking? Authorizing Provider  amoxicillin (AMOXIL) 500 MG capsule Take 2 capsules (1,000 mg total) by mouth 2 (two) times daily. Patient not taking: Reported on 06/11/2024 06/01/24   Haze Lonni PARAS, MD  atorvastatin (LIPITOR) 20 MG tablet Take 20 mg by mouth at bedtime. 04/24/24   [provider]  lidocaine  (XYLOCAINE ) 2 % solution Use as directed 15 mLs in the mouth or throat every 3 (three) hours as needed (throat pain). Patient not taking: Reported on 06/11/2024 06/01/24   Haze Lonni PARAS, MD  lisinopril-hydrochlorothiazide (ZESTORETIC) 10-12.5 MG tablet Take 1 tablet by mouth daily. 03/26/24   [provider]  meloxicam (MOBIC) 7.5 MG tablet Take 7.5 mg by mouth daily. Patient not taking: Reported on 06/11/2024 11/15/23   [provider]  metFORMIN  (GLUCOPHAGE ) 1000 MG tablet Take 0.5 tablets (500 mg total) by mouth 2 (two) times daily. 09/26/20   Landy Honora CROME, PA-C  ondansetron  (ZOFRAN ) 4 MG  tablet Take 1 tablet (4 mg total) by mouth every 8 (eight) hours as needed for nausea or vomiting. Patient not taking: Reported on 05/11/2024 11/16/23   Veta Palma, PA-C  pantoprazole (PROTONIX) 40 MG tablet Take 1 tablet (40 mg total) by mouth daily. 06/01/24   Haze Lonni PARAS, MD  sucralfate (CARAFATE) 1 g tablet Take 1 tablet (1 g total) by mouth 4 (four) times daily -  with meals and at bedtime. 06/01/24   Haze Lonni PARAS, MD  SUMAtriptan  (IMITREX ) 100 MG tablet Take 1 tablet at earliest onset of headache.  May repeat in 2 hours if headache persists/recurs.  Do not exceed 2 tablets in 24 hours Patient not taking: Reported on 05/11/2024 11/12/16   Skeet Juliene SAUNDERS, DO  tobramycin  (TOBREX ) 0.3 % ophthalmic solution Place 1 drop  into the left eye every 4 (four) hours. 03/29/20   Christopher Savannah, PA-C  topiramate  (TOPAMAX ) 50 MG tablet Take 1 tablet (50 mg total) by mouth at bedtime. 05/11/24   Gayland Lauraine PARAS, NP  nortriptyline  (PAMELOR ) 25 MG capsule Take 1 capsule (25 mg total) by mouth at bedtime. 11/12/16 03/29/20  Skeet Juliene SAUNDERS, DO    Family History History reviewed. No pertinent family history.  Social History Social History   Tobacco Use   Smoking status: Never   Smokeless tobacco: Never  Vaping Use   Vaping status: Never Used  Substance Use Topics   Alcohol use: No   Drug use: No     Allergies   Quinine derivatives   Review of Systems Review of Systems  Constitutional:  Positive for fever.  Cardiovascular:  Positive for chest pain.  Per HPI   Physical Exam Triage Vital Signs ED Triage Vitals [06/11/24 1116]  Encounter Vitals Group     BP 128/85     Girls Systolic BP Percentile      Girls Diastolic BP Percentile      Boys Systolic BP Percentile      Boys Diastolic BP Percentile      Pulse Rate 75     Resp 14     Temp 98.3 F (36.8 C)     Temp Source Oral     SpO2 99 %     Weight      Height      Head Circumference      Peak Flow      Pain Score 10     Pain Loc      Pain Education      Exclude from Growth Chart    No data found.  Updated Vital Signs BP 128/85 (BP Location: Left Arm)   Pulse 75   Temp 98.3 F (36.8 C) (Oral)   Resp 14   LMP 07/30/2016   SpO2 99%   Visual Acuity Right Eye Distance:   Left Eye Distance:   Bilateral Distance:    Right Eye Near:   Left Eye Near:    Bilateral Near:     Physical Exam Vitals and nursing note reviewed.  Constitutional:      Appearance: She is ill-appearing. She is not toxic-appearing.     Comments: Ill-appearing and fatigued appearing, however nontoxic in appearance.  HENT:     Head: Normocephalic and atraumatic.     Right Ear: Hearing, tympanic membrane, ear canal and external ear normal.     Left Ear: Hearing,  tympanic membrane, ear canal and external ear normal.     Nose: Nose normal.     Mouth/Throat:  Lips: Pink.     Mouth: Mucous membranes are moist. No injury or oral lesions.     Dentition: Normal dentition.     Tongue: No lesions.     Pharynx: Oropharynx is clear. Uvula midline. No pharyngeal swelling, oropharyngeal exudate, posterior oropharyngeal erythema, uvula swelling or postnasal drip.     Tonsils: No tonsillar exudate.  Eyes:     General: Lids are normal. Vision grossly intact. Gaze aligned appropriately.     Extraocular Movements: Extraocular movements intact.     Conjunctiva/sclera: Conjunctivae normal.  Neck:     Trachea: Trachea and phonation normal.  Cardiovascular:     Rate and Rhythm: Normal rate and regular rhythm.     Heart sounds: Normal heart sounds, S1 normal and S2 normal.  Pulmonary:     Effort: Pulmonary effort is normal. No respiratory distress.     Breath sounds: Normal breath sounds and air entry.  Abdominal:     General: Bowel sounds are normal.     Palpations: Abdomen is soft.     Tenderness: There is no abdominal tenderness. There is no right CVA tenderness, left CVA tenderness or guarding.  Musculoskeletal:     Cervical back: Neck supple.  Lymphadenopathy:     Cervical: No cervical adenopathy.  Skin:    General: Skin is warm and dry.     Capillary Refill: Capillary refill takes less than 2 seconds.     Findings: No rash.  Neurological:     General: No focal deficit present.     Mental Status: She is alert and oriented to person, place, and time. Mental status is at baseline.     Cranial Nerves: No dysarthria or facial asymmetry.  Psychiatric:        Mood and Affect: Mood normal.        Speech: Speech normal.        Behavior: Behavior normal.        Thought Content: Thought content normal.        Judgment: Judgment normal.      UC Treatments / Results  Labs (all labs ordered are listed, but only abnormal results are displayed) Labs  Reviewed  POCT FASTING CBG [facility] MANUAL ENTRY - Abnormal; Notable for the following components:      Result Value   POCT Glucose ([facility]) 140 (*)    All other components within normal limits  POC COVID19/FLU A&B COMBO    EKG   Radiology No results found.  Procedures Procedures (including critical care time)  Medications Ordered in UC Medications  ketorolac  (TORADOL ) 30 MG/ML injection 30 mg (30 mg Intramuscular Given 06/11/24 1229)    Initial Impression / Assessment and Plan / UC Course  I have reviewed the triage vital signs and the nursing notes.  Pertinent labs & imaging results that were available during my care of the patient were reviewed by me and considered in my medical decision making (see chart for details).   1.  Generalized body aches, viral syndrome, type 2 diabetes with hyperglycemia without long-term current use of insulin  Presentation suspicious for viral syndrome. POC testing for COVID-19 and flu are negative in clinic today. Ketorolac  30 mg IM given for relief of bodyaches with significant relief prior to discharge.  She is tolerating foods and fluids with improvement in sore throat and is stable for outpatient follow-up with ENT from an esophageal stricture standpoint.  Ill-appearing and fatigued appearing, therefore CBG  checked.  CBG is 140.  Recommend use of Tylenol   1000 mg every 6 hours as needed for body aches and pains.  ER precautions discussed.  Counseled patient on potential for adverse effects with medications prescribed/recommended today, strict ER and return-to-clinic precautions discussed, patient verbalized understanding.    Final Clinical Impressions(s) / UC Diagnoses   Final diagnoses:  Generalized body aches  Viral syndrome  Type 2 diabetes mellitus with hyperglycemia, without long-term current use of insulin  [facility])     Discharge Instructions      Your COVID and flu testing is negative here.  I suspect that you may have been  exposed to a virus from your recent ER visit causing your symptoms.  Please take Tylenol  1000 mg every 6 hours as needed for aches and pains.  We gave you a shot of ketorolac  in the clinic today which is a strong anti-inflammatory medication. This injection will continue to work in your body over the next 24 hours to relieve pain. Do not take any NSAID medicine (ibuprofen /aspirin) for the next 24 hours.  Continue taking your medications as prescribed by your primary care provider and from your recent ER visit for your esophageal stricture.  If you develop worsening sore throat, inability to swallow/eat, persistent vomiting, etc., please go to the ER. Otherwise, please follow-up with the ear nose and throat specialist on Monday, October 27 as scheduled.       ED Prescriptions   None    PDMP not reviewed this encounter.   Enedelia Dorna HERO, FNP 06/11/24 1446    Enedelia Dorna HERO, FNP 06/11/24 1446

## 2024-06-11 NOTE — Telephone Encounter (Signed)
 Telephone encounter opened to send in tylenol  per patient request.

## 2024-06-11 NOTE — ED Triage Notes (Addendum)
 Patient's husband is interpreting and providing information for the patient.  Patient's husband reports that the patient went to the ED on 05/31/24 with c/o chest pain and difficulty swallowing. Patient was diagnosed with Esophageal stricture, and Odynophagia.  Patient states she does not have a fever, but feels fever inside her body. Patient states she is swallowing better. Than before she went to the ED  Patient added that she is having pain all the way to her toes, but didi not mention this in the emergency room when she was there.

## 2024-06-11 NOTE — Discharge Instructions (Signed)
 Your COVID and flu testing is negative here.  I suspect that you may have been exposed to a virus from your recent ER visit causing your symptoms.  Please take Tylenol  1000 mg every 6 hours as needed for aches and pains.  We gave you a shot of ketorolac  in the clinic today which is a strong anti-inflammatory medication. This injection will continue to work in your body over the next 24 hours to relieve pain. Do not take any NSAID medicine (ibuprofen /aspirin) for the next 24 hours.  Continue taking your medications as prescribed by your primary care provider and from your recent ER visit for your esophageal stricture.  If you develop worsening sore throat, inability to swallow/eat, persistent vomiting, etc., please go to the ER. Otherwise, please follow-up with the ear nose and throat specialist on Monday, October 27 as scheduled.

## 2024-06-14 ENCOUNTER — Ambulatory Visit (INDEPENDENT_AMBULATORY_CARE_PROVIDER_SITE_OTHER): Admitting: Otolaryngology

## 2024-06-14 ENCOUNTER — Encounter (INDEPENDENT_AMBULATORY_CARE_PROVIDER_SITE_OTHER): Payer: Self-pay | Admitting: Otolaryngology

## 2024-06-14 VITALS — BP 122/79 | HR 83 | Ht 64.0 in

## 2024-06-14 DIAGNOSIS — R1319 Other dysphagia: Secondary | ICD-10-CM

## 2024-06-14 DIAGNOSIS — K219 Gastro-esophageal reflux disease without esophagitis: Secondary | ICD-10-CM

## 2024-06-14 DIAGNOSIS — R09A2 Foreign body sensation, throat: Secondary | ICD-10-CM | POA: Diagnosis not present

## 2024-06-14 MED ORDER — PANTOPRAZOLE SODIUM 40 MG PO TBEC: 40.0000 mg | DELAYED_RELEASE_TABLET | Freq: Every day | ORAL | 3 refills | Status: AC

## 2024-06-14 NOTE — Progress Notes (Signed)
 Otolaryngology Clinic Note HPI:  Initial visit (05/2024): Son brings her and translates. She declines nurse, learning disability. She feels like there is something in the throat. She went to the ED on 05/31/2024 and was diagnosed with globus and dysphagia and Rx with abx/decadron . She was seen in [facility] prior by GI (??) with possible surgery (dilation?) for this complaint about 2 years ago and then again a year ago, which helped a little bit. She denies any pain, maybe some slight trouble swallowing. She denies any antecedent event prior to 3 weeks since onset of sx. Denies any recent procedures or intubations. No change in diet. Does have some GERD sx currently. No prior swallow study.  Patient otherwise denies: - odynophagia, aspiration episodes or PNA, unintentional weight loss - changes in voice, shortness of breath, hemoptysis - ear pain, neck masses  H&N Surgery: denies Personal or FHx of bleeding dz or anesthesia difficulty: no  GLP-1: no AP/AC: no  Tobacco: no  PMHx: DM, Headaches  Independent Review of Additional Tests or Records:  Dr. Haze (05/31/2024) ED visit: noted sore throat and chest discomfort. Harder time eating and drinking as a result. Prior saw Dr. Tamela (GI) in [place] with dilations. Globus sensation; Dx: Dysphagia, globus; Rx: abx, decadron , PPI, f/u with ENT and GI Labs CBC and CMP 05/31/2024: WBC 5.8, Plt 202; Prior Eos - 100; BUN/Cr 13/1.18, K 3.1 GAS 05/31/2024: neg CT Neck with contrast interpreted independently (05/31/2024): mild soft tissue prominence pharyngeally with some enhancement including over vallecula, but no abscess appreciated. No necrotic LN appreciated PMH/Meds/All/SocHx/FamHx/ROS:   Past Medical History:  Diagnosis Date   Diabetes mellitus without complication (HCC)    Hypertension      History reviewed. No pertinent surgical history.  History reviewed. No pertinent family history.   Social Connections: Unknown (01/01/2022)   Received from [facility]   Social Network    Social Network: Not on file      Current Outpatient Medications:    acetaminophen  (TYLENOL ) 500 MG tablet, Take 2 tablets (1,000 mg total) by mouth every 6 (six) hours as needed., Disp: 30 tablet, Rfl: 0   acetaminophen  (TYLENOL ) 500 MG tablet, Take 2 tablets (1,000 mg total) by mouth every 6 (six) hours as needed., Disp: 30 tablet, Rfl: 0   atorvastatin (LIPITOR) 20 MG tablet, Take 20 mg by mouth at bedtime., Disp: , Rfl:    lisinopril-hydrochlorothiazide (ZESTORETIC) 10-12.5 MG tablet, Take 1 tablet by mouth daily., Disp: , Rfl:    metFORMIN  (GLUCOPHAGE ) 1000 MG tablet, Take 0.5 tablets (500 mg total) by mouth 2 (two) times daily., Disp: 30 tablet, Rfl: 0   pantoprazole (PROTONIX) 40 MG tablet, Take 1 tablet (40 mg total) by mouth daily., Disp: 30 tablet, Rfl: 3   sucralfate (CARAFATE) 1 g tablet, Take 1 tablet (1 g total) by mouth 4 (four) times daily -  with meals and at bedtime., Disp: 40 tablet, Rfl: 0   tobramycin  (TOBREX ) 0.3 % ophthalmic solution, Place 1 drop into the left eye every 4 (four) hours., Disp: 5 mL, Rfl: 0   topiramate  (TOPAMAX ) 50 MG tablet, Take 1 tablet (50 mg total) by mouth at bedtime., Disp: 90 tablet, Rfl: 3   traZODone (DESYREL) 50 MG tablet, Take 50 mg by mouth at bedtime., Disp: , Rfl:    amoxicillin (AMOXIL) 500 MG capsule, Take 2 capsules (1,000 mg total) by mouth 2 (two) times daily. (Patient not taking: Reported on 06/14/2024), Disp: 40 capsule, Rfl: 0   lidocaine  (XYLOCAINE ) 2 %  solution, Use as directed 15 mLs in the mouth or throat every 3 (three) hours as needed (throat pain). (Patient not taking: Reported on 06/14/2024), Disp: 200 mL, Rfl: 0   meloxicam (MOBIC) 7.5 MG tablet, Take 7.5 mg by mouth daily. (Patient not taking: Reported on 06/14/2024), Disp: , Rfl:    ondansetron  (ZOFRAN ) 4 MG tablet, Take 1 tablet (4 mg total) by mouth every 8 (eight) hours as needed for nausea or vomiting. (Patient not taking: Reported on  06/14/2024), Disp: 12 tablet, Rfl: 0   SUMAtriptan  (IMITREX ) 100 MG tablet, Take 1 tablet at earliest onset of headache.  May repeat in 2 hours if headache persists/recurs.  Do not exceed 2 tablets in 24 hours (Patient not taking: Reported on 06/14/2024), Disp: 10 tablet, Rfl: 2   Physical Exam:   BP 122/79 (BP Location: Right Arm, Patient Position: Sitting, Cuff Size: Large)   Pulse 83   Ht 5' 4 (1.626 m)   LMP 07/30/2016   SpO2 99%   BMI 29.08 kg/m   Salient findings:  CN II-XII intact Bilateral EAC clear and TM intact with well pneumatized middle ear spaces Anterior rhinoscopy: Septum intact; bilateral inferior turbinates without significant hypertrophy No lesions of oral cavity/oropharynx; No obviously palpable neck masses/lymphadenopathy/thyromegaly No respiratory distress or stridor; voice quality class 1; TFL was indicated to better evaluate the proximal airway, given the patient's history and exam findings, and is detailed below.   Seprately Identifiable Procedures:  Prior to initiating any procedures, risks/benefits/alternatives were explained to the patient and verbal consent obtained. Procedure Note Pre-procedure diagnosis: Globus sensation Post-procedure diagnosis: Same Procedure: Transnasal Fiberoptic Laryngoscopy, CPT 31575 - Mod 25 Indication: see above Complications: None apparent EBL: 0 mL  The procedure was undertaken to further evaluate the patient's complaint above, with mirror exam inadequate for appropriate examination due to gag reflex and poor patient tolerance  Procedure:  Patient was identified as correct patient. Verbal consent was obtained. The nose was sprayed with oxymetazoline and 4% lidocaine . The The flexible laryngoscope was passed through the nose to view the nasal cavity, pharynx (oropharynx, hypopharynx) and larynx.  The larynx was examined at rest and during multiple phonatory tasks. Documentation was obtained and reviewed with patient. The  scope was removed. The patient tolerated the procedure well.  Findings: The nasal cavity and nasopharynx did not reveal any masses or lesions, mucosa appeared to be without obvious lesions. The tongue base, pharyngeal walls, piriform sinuses, vallecula, epiglottis and postcricoid region are normal in appearance without noted masses. The visualized portion of the subglottis and proximal trachea is widely patent. The vocal folds are mobile bilaterally. There are no lesions on the free edge of the vocal folds nor elsewhere in the larynx worrisome for malignancy - do appreciate small subepithelial cyst left mid-VF  Electronically signed by: Eldora KATHEE Blanch, MD 06/27/2024 7:01 AM   Impression & Plans:  Tanya [name] is a 48 y.o. female with:  1. Laryngopharyngeal reflux (LPR)   2. Globus sensation   3. Other dysphagia    TFL reassuring. Appears sx more related to globus, not having much trouble swallowing but rather lump in throat. Prior GI eval and dilation (can't see notes). We discussed options and makes most sense to start with LPR Rx and MBS. We discussed globus mgmt and nature Small left VF cyst - no dysphonia, she opted to observe  - PPI daily, reflux gourmet after meals - f/u 8 weeks with MBS  See below regarding exact medications prescribed this encounter  including dosages and route: Meds ordered this encounter  Medications   pantoprazole (PROTONIX) 40 MG tablet    Sig: Take 1 tablet (40 mg total) by mouth daily.    Dispense:  30 tablet    Refill:  3      Thank you for allowing me the opportunity to care for your patient. Please do not hesitate to contact me should you have any other questions.  Sincerely, Eldora Blanch, MD Otolaryngologist (ENT), [facility] Phone: [phone] Fax: [phone]  06/27/2024, 7:01 AM   MDM:  [phone] Complexity/Problems addressed: mod - chronic problems Data complexity: high - independent review of note, labs; independent CT  interpretation - Morbidity: mod  - Drug prescribed or managed: y

## 2024-06-15 ENCOUNTER — Telehealth (HOSPITAL_COMMUNITY): Payer: Self-pay | Admitting: Otolaryngology

## 2024-06-15 NOTE — Telephone Encounter (Signed)
 Attempted to contact pt at [phone] to schedule swallow test. Left voicemail with request for call back. (AHARRIS)

## 2024-06-29 ENCOUNTER — Telehealth (HOSPITAL_COMMUNITY): Payer: Self-pay | Admitting: Otolaryngology

## 2024-06-29 NOTE — Telephone Encounter (Signed)
 3rd attempt to contact pt at both listed phone numbers to schedule swallow test. Left voicemail. If no call back by 07/06/24, order will be cancelled. (AHARRIS)

## 2024-07-06 ENCOUNTER — Telehealth (HOSPITAL_COMMUNITY): Payer: Self-pay | Admitting: Otolaryngology

## 2024-07-06 NOTE — Telephone Encounter (Signed)
 Order for op mbs cancelled - unable to contact patient. Outreaches made on 10/28, 11/4, and 11/11 with French interpreter. Left voicemail requesting back, never heard back. (ah)

## 2024-07-27 ENCOUNTER — Telehealth (INDEPENDENT_AMBULATORY_CARE_PROVIDER_SITE_OTHER): Payer: Self-pay

## 2024-07-27 NOTE — Telephone Encounter (Signed)
 Attempted to contact patient to reschedule and inform her that provider would need her to have SLP done before seeing him. No answer.

## 2024-07-28 ENCOUNTER — Ambulatory Visit (INDEPENDENT_AMBULATORY_CARE_PROVIDER_SITE_OTHER): Admitting: Otolaryngology

## 2024-09-11 ENCOUNTER — Emergency Department (HOSPITAL_COMMUNITY)

## 2024-09-11 ENCOUNTER — Emergency Department (HOSPITAL_COMMUNITY)
Admission: EM | Admit: 2024-09-11 | Discharge: 2024-09-11 | Disposition: A | Discharge status: Home / Self Care | Attending: Emergency Medicine | Admitting: Emergency Medicine

## 2024-09-11 ENCOUNTER — Other Ambulatory Visit: Payer: Self-pay

## 2024-09-11 ENCOUNTER — Ambulatory Visit (HOSPITAL_COMMUNITY): Admission: EM | Admit: 2024-09-11 | Discharge: 2024-09-11 | Disposition: A | Discharge status: Home / Self Care

## 2024-09-11 ENCOUNTER — Encounter (HOSPITAL_COMMUNITY): Payer: Self-pay | Admitting: Emergency Medicine

## 2024-09-11 DIAGNOSIS — Z7984 Long term (current) use of oral hypoglycemic drugs: Secondary | ICD-10-CM | POA: Diagnosis not present

## 2024-09-11 DIAGNOSIS — R509 Fever, unspecified: Secondary | ICD-10-CM | POA: Insufficient documentation

## 2024-09-11 DIAGNOSIS — E119 Type 2 diabetes mellitus without complications: Secondary | ICD-10-CM | POA: Diagnosis not present

## 2024-09-11 DIAGNOSIS — D72819 Decreased white blood cell count, unspecified: Secondary | ICD-10-CM | POA: Insufficient documentation

## 2024-09-11 DIAGNOSIS — I10 Essential (primary) hypertension: Secondary | ICD-10-CM | POA: Insufficient documentation

## 2024-09-11 DIAGNOSIS — E871 Hypo-osmolality and hyponatremia: Secondary | ICD-10-CM | POA: Diagnosis not present

## 2024-09-11 DIAGNOSIS — R519 Headache, unspecified: Secondary | ICD-10-CM | POA: Diagnosis not present

## 2024-09-11 LAB — BASIC METABOLIC PANEL WITH GFR
Anion gap: 12 (ref 5–15)
BUN: 12 mg/dL (ref 6–20)
CO2: 25 mmol/L (ref 22–32)
Calcium: 9.1 mg/dL (ref 8.9–10.3)
Chloride: 98 mmol/L (ref 98–111)
Creatinine, Ser: 1.06 mg/dL — ABNORMAL HIGH (ref 0.44–1.00)
GFR, Estimated: >60 mL/min
Glucose, Bld: 115 mg/dL — ABNORMAL HIGH (ref 70–99)
Potassium: 3.5 mmol/L (ref 3.5–5.1)
Sodium: 134 mmol/L — ABNORMAL LOW (ref 135–145)

## 2024-09-11 LAB — URINALYSIS, ROUTINE W REFLEX MICROSCOPIC
Bilirubin Urine: NEGATIVE
Glucose, UA: NEGATIVE mg/dL
Hgb urine dipstick: NEGATIVE
Ketones, ur: NEGATIVE mg/dL
Nitrite: NEGATIVE
Protein, ur: NEGATIVE mg/dL
Specific Gravity, Urine: 1.014 (ref 1.005–1.030)
pH: 6 (ref 5.0–8.0)

## 2024-09-11 LAB — CBC WITH DIFFERENTIAL/PLATELET
Abs Immature Granulocytes: 0.01 10*3/uL (ref 0.00–0.07)
Basophils Absolute: 0 10*3/uL (ref 0.0–0.1)
Basophils Relative: 0 %
Eosinophils Absolute: 0.5 10*3/uL (ref 0.0–0.5)
Eosinophils Relative: 14 %
HCT: 42 % (ref 36.0–46.0)
Hemoglobin: 14 g/dL (ref 12.0–15.0)
Immature Granulocytes: 0 %
Lymphocytes Relative: 22 %
Lymphs Abs: 0.7 10*3/uL (ref 0.7–4.0)
MCH: 28.7 pg (ref 26.0–34.0)
MCHC: 33.3 g/dL (ref 30.0–36.0)
MCV: 86.2 fL (ref 80.0–100.0)
Monocytes Absolute: 0.3 10*3/uL (ref 0.1–1.0)
Monocytes Relative: 9 %
Neutro Abs: 1.8 10*3/uL (ref 1.7–7.7)
Neutrophils Relative %: 55 %
Platelets: 130 10*3/uL — ABNORMAL LOW (ref 150–400)
RBC: 4.87 MIL/uL (ref 3.87–5.11)
RDW: 12.1 % (ref 11.5–15.5)
WBC: 3.2 10*3/uL — ABNORMAL LOW (ref 4.0–10.5)
nRBC: 0 % (ref 0.0–0.2)

## 2024-09-11 LAB — TECHNOLOGIST SMEAR REVIEW: Plt Morphology: NORMAL

## 2024-09-11 LAB — HEPATIC FUNCTION PANEL
ALT: 16 U/L (ref 0–44)
AST: 26 U/L (ref 15–41)
Albumin: 3.6 g/dL (ref 3.5–5.0)
Alkaline Phosphatase: 73 U/L (ref 38–126)
Bilirubin, Direct: 0.3 mg/dL — ABNORMAL HIGH (ref 0.0–0.2)
Indirect Bilirubin: 0.4 mg/dL (ref 0.3–0.9)
Total Bilirubin: 0.6 mg/dL (ref 0.0–1.2)
Total Protein: 6.3 g/dL — ABNORMAL LOW (ref 6.5–8.1)

## 2024-09-11 LAB — CBG MONITORING, ED: Glucose-Capillary: 105 mg/dL — ABNORMAL HIGH (ref 70–99)

## 2024-09-11 LAB — RESP PANEL BY RT-PCR (RSV, FLU A&B, COVID)  RVPGX2
Influenza A by PCR: NEGATIVE
Influenza B by PCR: NEGATIVE
Resp Syncytial Virus by PCR: NEGATIVE
SARS Coronavirus 2 by RT PCR: NEGATIVE

## 2024-09-11 LAB — PARASITE EXAM SCREEN, BLOOD-W CONF TO LABCORP (NOT @ ARMC)

## 2024-09-11 MED ORDER — MAGNESIUM SULFATE IN D5W 1-5 GM/100ML-% IV SOLN
1.0000 g | Freq: Once | INTRAVENOUS | Status: AC
  Administered 2024-09-11: 1 g via INTRAVENOUS
  Filled 2024-09-11: qty 100

## 2024-09-11 MED ORDER — DEXAMETHASONE SODIUM PHOSPHATE 4 MG/ML IJ SOLN
4.0000 mg | Freq: Once | INTRAMUSCULAR | Status: AC
  Administered 2024-09-11: 4 mg via INTRAVENOUS
  Filled 2024-09-11: qty 1

## 2024-09-11 MED ORDER — SODIUM CHLORIDE 0.9 % IV BOLUS
1000.0000 mL | Freq: Once | INTRAVENOUS | Status: AC
  Administered 2024-09-11: 1000 mL via INTRAVENOUS

## 2024-09-11 MED ORDER — METOCLOPRAMIDE HCL 5 MG/ML IJ SOLN
10.0000 mg | Freq: Once | INTRAMUSCULAR | Status: AC
  Administered 2024-09-11: 10 mg via INTRAVENOUS
  Filled 2024-09-11: qty 2

## 2024-09-11 MED ORDER — ACETAMINOPHEN 325 MG PO TABS
ORAL_TABLET | ORAL | Status: AC
  Filled 2024-09-11: qty 2

## 2024-09-11 MED ORDER — ACETAMINOPHEN 325 MG PO TABS
650.0000 mg | ORAL_TABLET | Freq: Once | ORAL | Status: AC
  Administered 2024-09-11: 650 mg via ORAL

## 2024-09-11 MED ORDER — KETOROLAC TROMETHAMINE 15 MG/ML IJ SOLN
15.0000 mg | Freq: Once | INTRAMUSCULAR | Status: AC
  Administered 2024-09-11: 15 mg via INTRAVENOUS
  Filled 2024-09-11: qty 1

## 2024-09-11 NOTE — ED Triage Notes (Addendum)
 Pt sent by UC. Pt c/o HA and eye pain bilat. Pt denies vision changes. Pt denies dizziness

## 2024-09-11 NOTE — ED Notes (Signed)
 Patient is being discharged from the Urgent Care and sent to the Emergency Department via pov . Per Jon Nimrod, NP, patient is in need of higher level of care due to limited resources. Patient is aware and verbalizes understanding of plan of care.  Vitals:   09/11/24 1537  BP: 103/67  Pulse: 94  Resp: 20  Temp: (!) 101.1 F (38.4 C)  SpO2: 96%

## 2024-09-11 NOTE — ED Notes (Signed)
 Pt made aware about urine sample, unable to go at this time.

## 2024-09-11 NOTE — ED Triage Notes (Signed)
 Complains of symptoms for one week.  Complains of fever and headache. Complains of pain behind eyes.  Denies runny nose or cough.    Taking topirimate  12/24-1/17 was in [place]

## 2024-09-11 NOTE — Discharge Instructions (Addendum)
 Requires higher level of care with resources not available at urgent care.

## 2024-09-11 NOTE — ED Provider Notes (Addendum)
 " [place] [facility] AT [facility] Provider Note   CSN: [id number] Arrival date & time: 09/11/24  8397     Patient presents with: Headache and Eye Pain   Tanya [name] is a 49 y.o. female.  Past medical history significant for hypertension and diabetes presents today from urgent care for headache, fever and eye pain bilaterally x 1 week.  Patient does have a history of migraines but states that this 1 feels different, patient has been taking topiramate  without relief of symptoms.  Patient also reports photophobia, phonophobia, blurred vision, fatigue, appetite change, chills, and bodyaches.  Patient denies shortness of breath, cough, nausea, vomiting, abdominal pain, or chest pain.  Patient recently traveled to [place] from 08/11/2024 through 09/04/2024 was not taking antimalarials, however does not recall getting bit by any mosquitoes.  Patient also brought bottled water exclusively drink that.   HPI     Prior to Admission medications  Medication Sig Start Date End Date Taking? Authorizing Provider  acetaminophen  (TYLENOL ) 500 MG tablet Take 2 tablets (1,000 mg total) by mouth every 6 (six) hours as needed. 06/11/24   Enedelia Dorna HERO, FNP  acetaminophen  (TYLENOL ) 500 MG tablet Take 2 tablets (1,000 mg total) by mouth every 6 (six) hours as needed. 06/11/24   Enedelia Dorna HERO, FNP  amoxicillin  (AMOXIL ) 500 MG capsule Take 2 capsules (1,000 mg total) by mouth 2 (two) times daily. Patient not taking: Reported on 06/14/2024 06/01/24   Haze Lonni PARAS, MD  atorvastatin (LIPITOR) 20 MG tablet Take 20 mg by mouth at bedtime. 04/24/24   [provider]  lidocaine  (XYLOCAINE ) 2 % solution Use as directed 15 mLs in the mouth or throat every 3 (three) hours as needed (throat pain). Patient not taking: Reported on 06/14/2024 06/01/24   Haze Lonni PARAS, MD  lisinopril-hydrochlorothiazide (ZESTORETIC) 10-12.5 MG tablet Take 1 tablet by mouth daily.  03/26/24   [provider]  meloxicam (MOBIC) 7.5 MG tablet Take 7.5 mg by mouth daily. Patient not taking: Reported on 06/14/2024 11/15/23   [provider]  metFORMIN  (GLUCOPHAGE ) 1000 MG tablet Take 0.5 tablets (500 mg total) by mouth 2 (two) times daily. 09/26/20   Landy Honora CROME, PA-C  ondansetron  (ZOFRAN ) 4 MG tablet Take 1 tablet (4 mg total) by mouth every 8 (eight) hours as needed for nausea or vomiting. Patient not taking: Reported on 06/14/2024 11/16/23   Veta Palma, PA-C  pantoprazole  (PROTONIX ) 40 MG tablet Take 1 tablet (40 mg total) by mouth daily. 06/14/24   Tobie Eldora NOVAK, MD  sucralfate  (CARAFATE ) 1 g tablet Take 1 tablet (1 g total) by mouth 4 (four) times daily -  with meals and at bedtime. 06/01/24   Haze Lonni PARAS, MD  SUMAtriptan  (IMITREX ) 100 MG tablet Take 1 tablet at earliest onset of headache.  May repeat in 2 hours if headache persists/recurs.  Do not exceed 2 tablets in 24 hours Patient not taking: Reported on 06/14/2024 11/12/16   Skeet Juliene SAUNDERS, DO  tobramycin  (TOBREX ) 0.3 % ophthalmic solution Place 1 drop into the left eye every 4 (four) hours. 03/29/20   Christopher Savannah, PA-C  topiramate  (TOPAMAX ) 50 MG tablet Take 1 tablet (50 mg total) by mouth at bedtime. 05/11/24   Gayland Lauraine PARAS, NP  traZODone (DESYREL) 50 MG tablet Take 50 mg by mouth at bedtime. 05/30/24   [provider]  nortriptyline  (PAMELOR ) 25 MG capsule Take 1 capsule (25 mg total) by mouth at bedtime. 11/12/16 03/29/20  Skeet Juliene SAUNDERS, DO    Allergies: Quinine derivatives    Review of Systems  Constitutional:  Positive for appetite change, chills, fatigue and fever.  HENT:  Positive for sinus pain.   Eyes:  Positive for photophobia and visual disturbance.  Neurological:  Positive for headaches.    Updated Vital Signs BP 97/63   Pulse 74   Temp 99.5 F (37.5 C)   Resp 18   Ht 5' 4 (1.626 m)   Wt 77 kg   LMP 07/30/2016   SpO2 100%   BMI 29.14 kg/m    Physical Exam Vitals and nursing note reviewed.  Constitutional:      General: She is not in acute distress.    Appearance: Normal appearance. She is well-developed. She is not toxic-appearing.  HENT:     Head: Normocephalic and atraumatic.     Right Ear: External ear normal.     Left Ear: External ear normal.  Eyes:     Extraocular Movements: Extraocular movements intact.     Conjunctiva/sclera: Conjunctivae normal.     Pupils: Pupils are equal, round, and reactive to light.  Cardiovascular:     Rate and Rhythm: Normal rate and regular rhythm.     Pulses: Normal pulses.     Heart sounds: Normal heart sounds. No murmur heard. Pulmonary:     Effort: Pulmonary effort is normal. No respiratory distress.     Breath sounds: Normal breath sounds.  Abdominal:     Palpations: Abdomen is soft.     Tenderness: There is no abdominal tenderness.  Musculoskeletal:        General: No swelling.     Cervical back: Neck supple.  Skin:    General: Skin is warm and dry.     Capillary Refill: Capillary refill takes less than 2 seconds.  Neurological:     General: No focal deficit present.     Mental Status: She is alert and oriented to person, place, and time.  Psychiatric:        Mood and Affect: Mood normal.     (all labs ordered are listed, but only abnormal results are displayed) Labs Reviewed  BASIC METABOLIC PANEL WITH GFR - Abnormal; Notable for the following components:      Result Value   Sodium 134 (*)    Glucose, Bld 115 (*)    Creatinine, Ser 1.06 (*)    All other components within normal limits  CBC WITH DIFFERENTIAL/PLATELET - Abnormal; Notable for the following components:   WBC 3.2 (*)    Platelets 130 (*)    All other components within normal limits  URINALYSIS, ROUTINE W REFLEX MICROSCOPIC - Abnormal; Notable for the following components:   APPearance HAZY (*)    Leukocytes,Ua SMALL (*)    Bacteria, UA RARE (*)    All other components within normal limits   HEPATIC FUNCTION PANEL - Abnormal; Notable for the following components:   Total Protein 6.3 (*)    Bilirubin, Direct 0.3 (*)    All other components within normal limits  CBG MONITORING, ED - Abnormal; Notable for the following components:   Glucose-Capillary 105 (*)    All other components within normal limits  RESP PANEL BY RT-PCR (RSV, FLU A&B, COVID)  [id number]  CULTURE, BLOOD (ROUTINE X 2)  CULTURE, BLOOD (ROUTINE X 2)  PARASITE EXAM SCREEN, BLOOD-W CONF TO [facility] (NOT @ [facility])  PLASMODIUM SP. PCR  TECHNOLOGIST SMEAR REVIEW  PARASITE EXAM, BLOOD    EKG: None  Radiology: CT Head Wo Contrast Result Date: 09/11/2024 EXAM: CT HEAD WITHOUT CONTRAST 09/11/2024 04:46:34 PM TECHNIQUE: CT of the head was performed without the administration of intravenous contrast. Automated exposure control, iterative reconstruction, and/or weight based adjustment of the mA/kV was utilized to reduce the radiation dose to as low as reasonably achievable. COMPARISON: 10/30/2008 CLINICAL HISTORY: Headache, increasing frequency or severity. FINDINGS: BRAIN AND VENTRICLES: No acute hemorrhage. No evidence of acute infarct. No hydrocephalus. No extra-axial collection. No mass effect or midline shift. ORBITS: No acute abnormality. SINUSES: No acute abnormality. SOFT TISSUES AND SKULL: No acute soft tissue abnormality. No skull fracture. IMPRESSION: 1. No acute intracranial abnormality. Electronically signed by: Morgane Naveau MD 09/11/2024 04:56 PM EST RP Workstation: [id number]     Procedures   Medications Ordered in the ED  sodium chloride  0.9 % bolus 1,000 mL (0 mLs Intravenous Stopped 09/11/24 1900)  ketorolac  (TORADOL ) 15 MG/ML injection 15 mg (15 mg Intravenous Given 09/11/24 1736)  metoCLOPramide  (REGLAN ) injection 10 mg (10 mg Intravenous Given 09/11/24 1736)  dexamethasone  (DECADRON ) injection 4 mg (4 mg Intravenous Given 09/11/24 1735)  magnesium  sulfate IVPB 1 g 100 mL (0 g Intravenous Stopped 09/11/24  1900)                                    Medical Decision Making Amount and/or Complexity of Data Reviewed Labs: ordered. Radiology: ordered.  Risk Prescription drug management.   This patient presents to the ED for concern of headache, body aches differential diagnosis includes COVID, flu, RSV, brain bleed, migraine, malaria, parasite   Lab Tests:  I Ordered, and personally interpreted labs.  The pertinent results include: CBG 105, mild hyponatremia 134, elevated creatinine at 1.06 which is around patient's baseline per historical values, mild leukopenia at 3.2, reduced platelets at 130, negative respiratory panel, UA with small leukocytes, rare bacteria, 11-20 squamous epithelials, and 11-20 WBCs, LFTs unremarkable   Imaging Studies ordered:  I ordered imaging studies including CT head Noncon I independently visualized and interpreted imaging which showed no acute intracranial abnormality. I agree with the radiologist interpretation   Medicines ordered and prescription drug management:  I ordered medication including IVF and headache cocktail    I have reviewed the patients home medicines and have made adjustments as needed   Problem List / ED Course:  Consulted infectious disease, Dr. Dennise who recommended blood cultures and follow-up with PCP. Upon reexamination after IV fluids and headache cocktail, patient states that her headache has improved significantly.  Patient appears to be resting comfortably in bed. Considered for admission or further workup however patient's vital signs, physical exam, imaging and labs are reassuring.  Patient has blood cultures and few labs pending and will be notified if these results require treatment.  Patient advised to follow-up with her PCP as soon as possible.  Patient given return precautions.  I feel patient is safe for discharge at this time.     Final diagnoses:  Fever, unspecified fever cause  Nonintractable headache,  unspecified chronicity pattern, unspecified headache type    ED Discharge Orders     None          Francis Ileana SAILOR, PA-C 09/11/24 2145    Francis Ileana SAILOR, PA-C 09/11/24 2155    Doretha Folks, MD 09/11/24 2210  "

## 2024-09-11 NOTE — ED Notes (Signed)
 Patient transported to CT

## 2024-09-11 NOTE — Discharge Instructions (Addendum)
 Today you were seen for fever and headache.  Your workup on the emergency department is reassuring.  Please alternate taking 1000 mg Tylenol  and 800 mg Motrin  every 4 hours as needed for pain and fever.  You have 1 or more labs pending will be notified if these results require treatment.  Please follow-up with your PCP as soon as possible.  Thank you for letting us  treat you today. After reviewing your labs and imaging, I feel you are safe to go home. Please follow up with your PCP in the next several days and provide them with your records from this visit. Return to the Emergency Room if pain becomes severe or symptoms worsen.

## 2024-09-11 NOTE — ED Notes (Signed)
 Visual acuity:  OS: 20/40      OD: 20/32

## 2024-09-11 NOTE — ED Provider Notes (Signed)
 " [facility]    CSN: [id number] Arrival date & time: 09/11/24  1429      History   Chief Complaint No chief complaint on file.   HPI Shianna Bally is a 49 y.o. female.   This 49 year old female is being seen for complaints of fever and headache ongoing for 1 week.  She reports history of migraines, but reports this headache is different.  Headache is frontal and behind bilateral eyes.  She reports blurry vision in both eyes.  She has been taking topiramate  without resolution of symptoms.  She recently travel to [place] from 08/11/2024 through 09/05/2023.  She denies sick contacts.     Past Medical History:  Diagnosis Date   Diabetes mellitus without complication (HCC)    Hypertension     Patient Active Problem List   Diagnosis Date Noted   Chronic daily headache 02/12/2021   Newly diagnosed diabetes (HCC) 10/20/2011   DKA (diabetic ketoacidosis) (HCC) 10/20/2011    History reviewed. No pertinent surgical history.  OB History     Gravida  3   Para  3   Term  3   Preterm      AB      Living  3      SAB      IAB      Ectopic      Multiple      Live Births  3            Home Medications    Prior to Admission medications  Medication Sig Start Date End Date Taking? Authorizing Provider  acetaminophen  (TYLENOL ) 500 MG tablet Take 2 tablets (1,000 mg total) by mouth every 6 (six) hours as needed. 06/11/24   Enedelia Dorna HERO, FNP  acetaminophen  (TYLENOL ) 500 MG tablet Take 2 tablets (1,000 mg total) by mouth every 6 (six) hours as needed. 06/11/24   Enedelia Dorna HERO, FNP  amoxicillin  (AMOXIL ) 500 MG capsule Take 2 capsules (1,000 mg total) by mouth 2 (two) times daily. Patient not taking: Reported on 06/14/2024 06/01/24   Haze Lonni PARAS, MD  atorvastatin (LIPITOR) 20 MG tablet Take 20 mg by mouth at bedtime. 04/24/24   [provider]  lidocaine  (XYLOCAINE ) 2 % solution Use as directed 15 mLs in the mouth or  throat every 3 (three) hours as needed (throat pain). Patient not taking: Reported on 06/14/2024 06/01/24   Haze Lonni PARAS, MD  lisinopril-hydrochlorothiazide (ZESTORETIC) 10-12.5 MG tablet Take 1 tablet by mouth daily. 03/26/24   [provider]  meloxicam (MOBIC) 7.5 MG tablet Take 7.5 mg by mouth daily. Patient not taking: Reported on 06/14/2024 11/15/23   [provider]  metFORMIN  (GLUCOPHAGE ) 1000 MG tablet Take 0.5 tablets (500 mg total) by mouth 2 (two) times daily. 09/26/20   Landy Honora CROME, PA-C  ondansetron  (ZOFRAN ) 4 MG tablet Take 1 tablet (4 mg total) by mouth every 8 (eight) hours as needed for nausea or vomiting. Patient not taking: Reported on 06/14/2024 11/16/23   Veta Palma, PA-C  pantoprazole  (PROTONIX ) 40 MG tablet Take 1 tablet (40 mg total) by mouth daily. 06/14/24   Tobie Eldora NOVAK, MD  sucralfate  (CARAFATE ) 1 g tablet Take 1 tablet (1 g total) by mouth 4 (four) times daily -  with meals and at bedtime. 06/01/24   Haze Lonni PARAS, MD  SUMAtriptan  (IMITREX ) 100 MG tablet Take 1 tablet at earliest onset of headache.  May repeat in 2 hours if headache persists/recurs.  Do  not exceed 2 tablets in 24 hours Patient not taking: Reported on 06/14/2024 11/12/16   Skeet Juliene SAUNDERS, DO  tobramycin  (TOBREX ) 0.3 % ophthalmic solution Place 1 drop into the left eye every 4 (four) hours. 03/29/20   Christopher Savannah, PA-C  topiramate  (TOPAMAX ) 50 MG tablet Take 1 tablet (50 mg total) by mouth at bedtime. 05/11/24   Gayland Lauraine PARAS, NP  traZODone (DESYREL) 50 MG tablet Take 50 mg by mouth at bedtime. 05/30/24   [provider]  nortriptyline  (PAMELOR ) 25 MG capsule Take 1 capsule (25 mg total) by mouth at bedtime. 11/12/16 03/29/20  Skeet Juliene SAUNDERS, DO    Family History History reviewed. No pertinent family history.  Social History Social History[1]   Allergies   Quinine derivatives   Review of Systems Review of Systems  Constitutional:  Positive for  activity change, chills, fatigue and fever.  Eyes:  Positive for photophobia.  Respiratory:  Negative for shortness of breath.   Cardiovascular:  Negative for chest pain.  Gastrointestinal:  Negative for abdominal pain, nausea and vomiting.  Musculoskeletal:  Positive for neck pain.  Neurological:  Positive for headaches.     Physical Exam Triage Vital Signs ED Triage Vitals  Encounter Vitals Group     BP 09/11/24 1537 103/67     Girls Systolic BP Percentile --      Girls Diastolic BP Percentile --      Boys Systolic BP Percentile --      Boys Diastolic BP Percentile --      Pulse Rate 09/11/24 1537 94     Resp 09/11/24 1537 20     Temp 09/11/24 1537 (!) 101.1 F (38.4 C)     Temp Source 09/11/24 1537 Oral     SpO2 09/11/24 1537 96 %     Weight --      Height --      Head Circumference --      Peak Flow --      Pain Score 09/11/24 1530 10     Pain Loc --      Pain Education --      Exclude from Growth Chart --    No data found.  Updated Vital Signs BP 103/67 (BP Location: Right Arm)   Pulse 94   Temp (!) 101.1 F (38.4 C) (Oral)   Resp 20   LMP 07/30/2016   SpO2 96%   Visual Acuity Right Eye Distance:   Left Eye Distance:   Bilateral Distance:    Right Eye Near:   Left Eye Near:    Bilateral Near:     Physical Exam Vitals and nursing note reviewed.  Constitutional:      General: She is not in acute distress.    Appearance: She is well-developed. She is ill-appearing.     Comments: Ill-appearing female appearing stated age found sitting in chair in no acute distress.  HENT:     Head: Normocephalic and atraumatic.  Eyes:     General: Lids are normal.        Right eye: No discharge.        Left eye: No discharge.     Conjunctiva/sclera: Conjunctivae normal.  Cardiovascular:     Rate and Rhythm: Normal rate and regular rhythm.     Heart sounds: Normal heart sounds. No murmur heard. Pulmonary:     Effort: Pulmonary effort is normal. No respiratory  distress.     Breath sounds: Normal breath sounds.  Abdominal:  General: Bowel sounds are normal.     Palpations: Abdomen is soft.     Tenderness: There is no abdominal tenderness.  Musculoskeletal:     Cervical back: Pain with movement present.  Skin:    General: Skin is warm and dry.     Capillary Refill: Capillary refill takes less than 2 seconds.  Neurological:     Mental Status: She is alert and oriented to person, place, and time.     GCS: GCS eye subscore is 3. GCS verbal subscore is 5. GCS motor subscore is 6.  Psychiatric:        Mood and Affect: Mood normal.      UC Treatments / Results  Labs (all labs ordered are listed, but only abnormal results are displayed) Labs Reviewed - No data to display  EKG   Radiology No results found.  Procedures Procedures (including critical care time)  Medications Ordered in UC Medications  acetaminophen  (TYLENOL ) tablet 650 mg (650 mg Oral Given 09/11/24 1542)    Initial Impression / Assessment and Plan / UC Course  I have reviewed the triage vital signs and the nursing notes.  Pertinent labs & imaging results that were available during my care of the patient were reviewed by me and considered in my medical decision making (see chart for details).     Vitals and triage reviewed, patient is hemodynamically stable.  She is ill-appearing presenting with complaints of fever, severe headache for 1 week.  She has taken topiramate  without relief of symptoms.  She reports this headache is different from her typical migraine headaches.  She is given acetaminophen  in clinic.  She had recent travel to [place].  She requires higher level of care with resources not available at urgent care.  She is advised to go to the emergency department for further evaluation.  She is ambulatory without incident.  She is stable for private transport. Final Clinical Impressions(s) / UC Diagnoses   Final diagnoses:  None   Discharge  Instructions   None    ED Prescriptions   None    PDMP not reviewed this encounter.    [1]  Social History Tobacco Use   Smoking status: Never   Smokeless tobacco: Never  Vaping Use   Vaping status: Never Used  Substance Use Topics   Alcohol use: No   Drug use: No     Lennice Jon BROCKS, FNP 09/11/24 1606  "

## 2024-09-15 LAB — PARASITE EXAM, BLOOD

## 2024-09-15 LAB — PLASMODIUM SP. PCR: Plasmodium Sp. PCR: NEGATIVE
